# Patient Record
Sex: Female | Born: 1995 | Race: White | Hispanic: No | Smoking: Never smoker
Health system: Southern US, Community
[De-identification: ages and names within clinical notes are randomized; demographics above are authoritative.]

## PROBLEM LIST (undated history)

## (undated) DIAGNOSIS — R011 Cardiac murmur, unspecified: Secondary | ICD-10-CM

## (undated) HISTORY — PX: NO PAST SURGERIES: SHX2092

## (undated) HISTORY — DX: Cardiac murmur, unspecified: R01.1

---
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
## 2016-01-20 ENCOUNTER — Emergency Department
Admission: EM | Admit: 2016-01-20 | Discharge: 2016-01-20 | Disposition: A | Discharge status: Home / Self Care | Payer: Medicaid Other | Attending: Emergency Medicine | Admitting: Emergency Medicine

## 2016-01-20 DIAGNOSIS — T171XXA Foreign body in nostril, initial encounter: Secondary | ICD-10-CM | POA: Diagnosis present

## 2016-01-20 DIAGNOSIS — Z880 Allergy status to penicillin: Secondary | ICD-10-CM | POA: Insufficient documentation

## 2016-01-20 DIAGNOSIS — W268XXA Contact with other sharp object(s), not elsewhere classified, initial encounter: Secondary | ICD-10-CM | POA: Diagnosis not present

## 2016-01-20 DIAGNOSIS — Y9389 Activity, other specified: Secondary | ICD-10-CM | POA: Insufficient documentation

## 2016-01-20 DIAGNOSIS — Y998 Other external cause status: Secondary | ICD-10-CM | POA: Diagnosis not present

## 2016-01-20 DIAGNOSIS — Z189 Retained foreign body fragments, unspecified material: Secondary | ICD-10-CM

## 2016-01-20 DIAGNOSIS — Y929 Unspecified place or not applicable: Secondary | ICD-10-CM | POA: Diagnosis not present

## 2016-01-20 DIAGNOSIS — W278XXA Contact with other nonpowered hand tool, initial encounter: Secondary | ICD-10-CM

## 2016-01-20 NOTE — ED Notes (Signed)
Patient reports had nose pierced on Thursday.  Today unable to get stud out of her nose.

## 2016-01-20 NOTE — Discharge Instructions (Signed)
Keep the nose piercing site clean and covered with antibiotic ointment.

## 2016-01-21 NOTE — ED Provider Notes (Signed)
CSN: [id number]     Arrival date & time 01/20/16  1943 History   First MD Initiated Contact with Patient 01/20/16 2202     Chief Complaint  Patient presents with  . Foreign Body in Nose   HPI  20 year old female patient presents to the ED for removal of a small piercing to the left nostril. Patient describes having the small piercing done on Thursday, about 3 days prior to arrival. Since that time she's had some increased swelling at the piercing site. Today she was unable to remove the stud as the swelling caused the head of the stud to get embedded in the nose. The patient denies any other injury at this time. Local pain and swelling to the left nostril. It is a discomfort is 7/10 in triage.  Past Medical History  Diagnosis Date  . Murmur    No past surgical history on file. Family History  Problem Relation Age of Onset  . Hypertension Father   . Heart disease Maternal Grandfather    Social History  Substance Use Topics  . Smoking status: Never Smoker   . Smokeless tobacco: Not on file  . Alcohol Use: No   OB History    Gravida Para Term Preterm AB TAB SAB Ectopic Multiple Living   0 0 0 0 0 0 0 0 0 0      Review of Systems  Constitutional: Negative.   HENT: Negative.        Left naris foreign body as above  Respiratory: Negative.   Skin: Negative.       Allergies  Amoxicillin; Penicillin g; and Penicillins  Home Medications   Prior to Admission medications   Medication Sig Start Date End Date Taking? Authorizing Provider  butalbital-acetaminophen-caffeine (FIORICET, ESGIC) 50-325-40 MG tablet Take by mouth 2 (two) times daily as needed for headache.    Historical Provider, MD  diclofenac sodium (VOLTAREN) 1 % GEL Apply 2 g topically 4 (four) times daily. Rub into affected area of foot 2 to 4 times daily 12/21/14   Vivi BarrackMatthew R Wagoner, DPM  Loratadine (CLARITIN PO) Take by mouth as needed.    Historical Provider, MD  norethindrone-ethinyl estradiol (JUNEL FE 1/20) 1-20  MG-MCG tablet Take 1 tablet by mouth daily. 12/25/15   Hildred LaserAnika Cherry, MD   BP 128/65 mmHg  Pulse 82  Temp(Src) 99.1 F (37.3 C) (Oral)  Resp 16  Ht 5\' 8"  (1.727 m)  Wt 104.327 kg  BMI 34.98 kg/m2  SpO2 98%  LMP 01/15/2016 (Exact Date) Physical Exam  Constitutional: She appears well-developed and well-nourished.  HENT:  Head: Normocephalic and atraumatic.  Left naris with local erythema to the small piercing site. Earring post is visible in the left vestibule.  Eyes: EOM are normal. Pupils are equal, round, and reactive to light.  Pulmonary/Chest: Effort normal and breath sounds normal.  Skin: Skin is warm and dry.    ED Course  .Foreign Body Removal Date/Time: 01/21/2016 12:51 AM Performed by: Lissa HoardMENSHEW, Remijio Holleran V BACON Authorized by: Lissa HoardMENSHEW, Yasaman Kolek V BACON Consent: Verbal consent obtained. Written consent not obtained. Risks and benefits: risks, benefits and alternatives were discussed Consent given by: patient Patient understanding: patient states understanding of the procedure being performed Patient consent: the patient's understanding of the procedure matches consent given Procedure consent: procedure consent matches procedure scheduled Site marked: the operative site was marked Patient identity confirmed: verbally with patient Body area: nose Location details: left nostril Anesthesia: local infiltration Local anesthetic: topical anesthetic Patient sedated: no Patient  restrained: no Patient cooperative: yes Localization method: visualized Removal mechanism: forceps Complexity: simple 1 objects recovered. Objects recovered: embedded nose pierce Post-procedure assessment: foreign body removed Patient tolerance: Patient tolerated the procedure well with no immediate complications Comments: Patient s/p successful removal of a partially-embedded nasal stud in the left naris   (including critical care time)  Labs Review Labs Reviewed - No data to display  Imaging  Review No results found.    EKG Interpretation None      MDM   Final diagnoses:  Body piercing as cause of accidental injury  Embedded foreign body    Patient with an acute embedded foreign body to the left nostril also by a recent nasal piercing, s/p removal. Patient is discharged with instructions to keep the local wound clean and cover with antibiotic ointment. She will follow with primary care provider or return to the ED as needed.    Charlesetta Ivory [place], PA-C 01/21/16 0059  Jennye Moccasin, MD 01/21/16 [phone]

## 2017-02-12 ENCOUNTER — Other Ambulatory Visit (INDEPENDENT_AMBULATORY_CARE_PROVIDER_SITE_OTHER): Payer: Self-pay | Admitting: Otolaryngology

## 2017-02-12 DIAGNOSIS — J329 Chronic sinusitis, unspecified: Secondary | ICD-10-CM

## 2017-02-17 ENCOUNTER — Ambulatory Visit
Admission: RE | Admit: 2017-02-17 | Discharge: 2017-02-17 | Disposition: A | Discharge status: Home / Self Care | Payer: BLUE CROSS/BLUE SHIELD | Source: Ambulatory Visit | Attending: Otolaryngology | Admitting: Otolaryngology

## 2017-02-17 DIAGNOSIS — J329 Chronic sinusitis, unspecified: Secondary | ICD-10-CM

## 2018-02-03 ENCOUNTER — Other Ambulatory Visit: Payer: Self-pay | Admitting: Otolaryngology

## 2018-02-04 ENCOUNTER — Other Ambulatory Visit: Payer: Self-pay

## 2018-02-04 ENCOUNTER — Encounter (HOSPITAL_BASED_OUTPATIENT_CLINIC_OR_DEPARTMENT_OTHER): Payer: Self-pay | Admitting: *Deleted

## 2018-02-23 ENCOUNTER — Encounter (HOSPITAL_BASED_OUTPATIENT_CLINIC_OR_DEPARTMENT_OTHER): Payer: Self-pay | Admitting: Anesthesiology

## 2018-02-23 ENCOUNTER — Ambulatory Visit (HOSPITAL_BASED_OUTPATIENT_CLINIC_OR_DEPARTMENT_OTHER): Payer: BLUE CROSS/BLUE SHIELD | Admitting: Anesthesiology

## 2018-02-23 ENCOUNTER — Other Ambulatory Visit: Payer: Self-pay

## 2018-02-23 ENCOUNTER — Encounter (HOSPITAL_BASED_OUTPATIENT_CLINIC_OR_DEPARTMENT_OTHER)
Admission: RE | Disposition: A | Discharge status: Home / Self Care | Payer: Self-pay | Source: Ambulatory Visit | Attending: Otolaryngology

## 2018-02-23 ENCOUNTER — Ambulatory Visit (HOSPITAL_BASED_OUTPATIENT_CLINIC_OR_DEPARTMENT_OTHER)
Admission: RE | Admit: 2018-02-23 | Discharge: 2018-02-23 | Disposition: A | Discharge status: Home / Self Care | Payer: BLUE CROSS/BLUE SHIELD | Source: Ambulatory Visit | Attending: Otolaryngology | Admitting: Otolaryngology

## 2018-02-23 DIAGNOSIS — J310 Chronic rhinitis: Secondary | ICD-10-CM | POA: Insufficient documentation

## 2018-02-23 DIAGNOSIS — J343 Hypertrophy of nasal turbinates: Secondary | ICD-10-CM | POA: Diagnosis not present

## 2018-02-23 DIAGNOSIS — Z6841 Body Mass Index (BMI) 40.0 and over, adult: Secondary | ICD-10-CM | POA: Diagnosis not present

## 2018-02-23 DIAGNOSIS — J3489 Other specified disorders of nose and nasal sinuses: Secondary | ICD-10-CM | POA: Insufficient documentation

## 2018-02-23 DIAGNOSIS — E669 Obesity, unspecified: Secondary | ICD-10-CM | POA: Diagnosis not present

## 2018-02-23 HISTORY — PX: TURBINATE REDUCTION: SHX6157

## 2018-02-23 LAB — POCT PREGNANCY, URINE: Preg Test, Ur: NEGATIVE

## 2018-02-23 SURGERY — REDUCTION, NASAL TURBINATE
Anesthesia: General | Laterality: Bilateral

## 2018-02-23 MED ORDER — PROPOFOL 10 MG/ML IV BOLUS
INTRAVENOUS | Status: DC | PRN
  Administered 2018-02-23: 20 mg via INTRAVENOUS
  Administered 2018-02-23: 180 mg via INTRAVENOUS

## 2018-02-23 MED ORDER — CLINDAMYCIN PHOSPHATE 900 MG/50ML IV SOLN
INTRAVENOUS | Status: AC
  Filled 2018-02-23: qty 50

## 2018-02-23 MED ORDER — FENTANYL CITRATE (PF) 100 MCG/2ML IJ SOLN
INTRAMUSCULAR | Status: AC
  Filled 2018-02-23: qty 2

## 2018-02-23 MED ORDER — ROCURONIUM BROMIDE 50 MG/5ML IV SOLN
INTRAVENOUS | Status: AC
  Filled 2018-02-23: qty 1

## 2018-02-23 MED ORDER — LIDOCAINE HCL (CARDIAC) PF 100 MG/5ML IV SOSY
PREFILLED_SYRINGE | INTRAVENOUS | Status: AC
  Filled 2018-02-23: qty 5

## 2018-02-23 MED ORDER — PROMETHAZINE HCL 25 MG/ML IJ SOLN
INTRAMUSCULAR | Status: AC
  Filled 2018-02-23: qty 1

## 2018-02-23 MED ORDER — SCOPOLAMINE 1 MG/3DAYS TD PT72
1.0000 | MEDICATED_PATCH | TRANSDERMAL | Status: DC
  Administered 2018-02-23: 1.5 mg via TRANSDERMAL

## 2018-02-23 MED ORDER — ONDANSETRON HCL 4 MG/2ML IJ SOLN
INTRAMUSCULAR | Status: DC | PRN
  Administered 2018-02-23: 4 mg via INTRAVENOUS

## 2018-02-23 MED ORDER — ONDANSETRON HCL 4 MG/2ML IJ SOLN
INTRAMUSCULAR | Status: AC
  Filled 2018-02-23: qty 2

## 2018-02-23 MED ORDER — PROMETHAZINE HCL 25 MG/ML IJ SOLN
6.2500 mg | Freq: Once | INTRAMUSCULAR | Status: DC | PRN
  Administered 2018-02-23: 6.25 mg via INTRAVENOUS

## 2018-02-23 MED ORDER — MIDAZOLAM HCL 2 MG/2ML IJ SOLN
1.0000 mg | INTRAMUSCULAR | Status: DC | PRN
  Administered 2018-02-23: 2 mg via INTRAVENOUS

## 2018-02-23 MED ORDER — OXYCODONE-ACETAMINOPHEN 5-325 MG PO TABS: 1.0000 | ORAL_TABLET | ORAL | 0 refills | Status: DC | PRN

## 2018-02-23 MED ORDER — AZITHROMYCIN 500 MG PO TABS: 500.0000 mg | ORAL_TABLET | Freq: Every day | ORAL | 0 refills | Status: AC

## 2018-02-23 MED ORDER — SCOPOLAMINE 1 MG/3DAYS TD PT72
MEDICATED_PATCH | TRANSDERMAL | Status: AC
  Filled 2018-02-23: qty 1

## 2018-02-23 MED ORDER — SCOPOLAMINE 1 MG/3DAYS TD PT72: 1.0000 | MEDICATED_PATCH | Freq: Once | TRANSDERMAL | Status: DC | PRN

## 2018-02-23 MED ORDER — CLINDAMYCIN PHOSPHATE 900 MG/50ML IV SOLN
INTRAVENOUS | Status: DC | PRN
  Administered 2018-02-23: 900 mg via INTRAVENOUS

## 2018-02-23 MED ORDER — LIDOCAINE HCL (CARDIAC) PF 100 MG/5ML IV SOSY
PREFILLED_SYRINGE | INTRAVENOUS | Status: DC | PRN
  Administered 2018-02-23: 50 mg via INTRAVENOUS

## 2018-02-23 MED ORDER — FENTANYL CITRATE (PF) 100 MCG/2ML IJ SOLN: 25.0000 ug | INTRAMUSCULAR | Status: DC | PRN

## 2018-02-23 MED ORDER — METOCLOPRAMIDE HCL 5 MG/ML IJ SOLN
10.0000 mg | Freq: Once | INTRAMUSCULAR | Status: AC | PRN
  Administered 2018-02-23: 10 mg via INTRAVENOUS
  Filled 2018-02-23: qty 2

## 2018-02-23 MED ORDER — FENTANYL CITRATE (PF) 100 MCG/2ML IJ SOLN
50.0000 ug | INTRAMUSCULAR | Status: DC | PRN
  Administered 2018-02-23 (×2): 50 ug via INTRAVENOUS

## 2018-02-23 MED ORDER — MIDAZOLAM HCL 2 MG/2ML IJ SOLN
INTRAMUSCULAR | Status: AC
  Filled 2018-02-23: qty 2

## 2018-02-23 MED ORDER — HYDROCODONE-ACETAMINOPHEN 7.5-325 MG PO TABS: 1.0000 | ORAL_TABLET | Freq: Once | ORAL | Status: DC | PRN

## 2018-02-23 MED ORDER — OXYMETAZOLINE HCL 0.05 % NA SOLN
NASAL | Status: DC | PRN
  Administered 2018-02-23: 1

## 2018-02-23 MED ORDER — MEPERIDINE HCL 25 MG/ML IJ SOLN
6.2500 mg | INTRAMUSCULAR | Status: DC | PRN
Start: 2018-02-23 — End: 2018-02-23

## 2018-02-23 MED ORDER — PROPOFOL 10 MG/ML IV BOLUS
INTRAVENOUS | Status: AC
  Filled 2018-02-23: qty 20

## 2018-02-23 MED ORDER — SUCCINYLCHOLINE CHLORIDE 20 MG/ML IJ SOLN
INTRAMUSCULAR | Status: DC | PRN
  Administered 2018-02-23: 100 mg via INTRAVENOUS

## 2018-02-23 MED ORDER — LACTATED RINGERS IV SOLN
INTRAVENOUS | Status: DC
  Administered 2018-02-23: 10:00:00 via INTRAVENOUS

## 2018-02-23 MED ORDER — SODIUM CHLORIDE 0.9 % IJ SOLN
INTRAMUSCULAR | Status: AC
  Filled 2018-02-23: qty 10

## 2018-02-23 MED ORDER — DEXAMETHASONE SODIUM PHOSPHATE 10 MG/ML IJ SOLN
INTRAMUSCULAR | Status: AC
  Filled 2018-02-23: qty 1

## 2018-02-23 MED ORDER — METOCLOPRAMIDE HCL 5 MG/ML IJ SOLN: 10.0000 mg | Freq: Once | INTRAMUSCULAR | Status: DC

## 2018-02-23 MED ORDER — DEXAMETHASONE SODIUM PHOSPHATE 4 MG/ML IJ SOLN
INTRAMUSCULAR | Status: DC | PRN
  Administered 2018-02-23: 10 mg via INTRAVENOUS

## 2018-02-23 SURGICAL SUPPLY — 25 items
ATTRACTOMAT 16X20 MAGNETIC DRP (DRAPES) IMPLANT
CANISTER SUCT 1200ML W/VALVE (MISCELLANEOUS) ×3 IMPLANT
COAGULATOR SUCT 8FR VV (MISCELLANEOUS) ×3 IMPLANT
DECANTER SPIKE VIAL GLASS SM (MISCELLANEOUS) IMPLANT
ELECT REM PT RETURN 9FT ADLT (ELECTROSURGICAL) ×3
ELECTRODE REM PT RTRN 9FT ADLT (ELECTROSURGICAL) ×1 IMPLANT
GLOVE BIO SURGEON STRL SZ7 (GLOVE) ×3 IMPLANT
GLOVE BIO SURGEON STRL SZ7.5 (GLOVE) ×3 IMPLANT
GOWN STRL REUS W/ TWL LRG LVL3 (GOWN DISPOSABLE) ×1 IMPLANT
GOWN STRL REUS W/ TWL XL LVL3 (GOWN DISPOSABLE) ×1 IMPLANT
GOWN STRL REUS W/TWL LRG LVL3 (GOWN DISPOSABLE) ×2
GOWN STRL REUS W/TWL XL LVL3 (GOWN DISPOSABLE) ×2
NEEDLE HYPO 25X1 1.5 SAFETY (NEEDLE) IMPLANT
NS IRRIG 1000ML POUR BTL (IV SOLUTION) IMPLANT
PACK BASIN DAY SURGERY FS (CUSTOM PROCEDURE TRAY) ×3 IMPLANT
PACK ENT DAY SURGERY (CUSTOM PROCEDURE TRAY) ×3 IMPLANT
PACKING NASAL EPIS 4X2.4 XEROG (MISCELLANEOUS) IMPLANT
PATTIES SURGICAL .5 X3 (DISPOSABLE) IMPLANT
SLEEVE SCD COMPRESS KNEE MED (MISCELLANEOUS) IMPLANT
SOLUTION BUTLER CLEAR DIP (MISCELLANEOUS) ×3 IMPLANT
SPONGE GAUZE 2X2 8PLY STER LF (GAUZE/BANDAGES/DRESSINGS) ×1
SPONGE GAUZE 2X2 8PLY STRL LF (GAUZE/BANDAGES/DRESSINGS) ×2 IMPLANT
SPONGE NEURO XRAY DETECT 1X3 (DISPOSABLE) ×3 IMPLANT
TOWEL OR 17X24 6PK STRL BLUE (TOWEL DISPOSABLE) ×3 IMPLANT
YANKAUER SUCT BULB TIP NO VENT (SUCTIONS) ×3 IMPLANT

## 2018-02-23 NOTE — Anesthesia Preprocedure Evaluation (Signed)
Anesthesia Evaluation  Patient identified by MRN, date of birth, ID band Patient awake    Reviewed: Allergy & Precautions, NPO status , Patient's Chart, lab work & pertinent test results  Airway Mallampati: II  TM Distance: >3 FB Neck ROM: Full    Dental no notable dental hx. (+) Teeth Intact   Pulmonary  Bilateral turbinate hypertrophy   Pulmonary exam normal breath sounds clear to auscultation       Cardiovascular Normal cardiovascular exam+ Valvular Problems/Murmurs  Rhythm:Regular Rate:Normal     Neuro/Psych negative neurological ROS  negative psych ROS   GI/Hepatic negative GI ROS, Neg liver ROS,   Endo/Other  Obesity  Renal/GU negative Renal ROS  negative genitourinary   Musculoskeletal negative musculoskeletal ROS (+)   Abdominal (+) + obese,   Peds  Hematology negative hematology ROS (+)   Anesthesia Other Findings   Reproductive/Obstetrics                             Anesthesia Physical Anesthesia Plan  ASA: II  Anesthesia Plan: General   Post-op Pain Management:    Induction: Intravenous  PONV Risk Score and Plan: 4 or greater and Scopolamine patch - Pre-op, Midazolam, Dexamethasone, Ondansetron and Treatment may vary due to age or medical condition  Airway Management Planned: Oral ETT  Additional Equipment:   Intra-op Plan:   Post-operative Plan: Extubation in OR  Informed Consent: I have reviewed the patients History and Physical, chart, labs and discussed the procedure including the risks, benefits and alternatives for the proposed anesthesia with the patient or authorized representative who has indicated his/her understanding and acceptance.   Dental advisory given  Plan Discussed with: CRNA, Anesthesiologist and Surgeon  Anesthesia Plan Comments:         Anesthesia Quick Evaluation

## 2018-02-23 NOTE — Discharge Instructions (Addendum)
POSTOPERATIVE INSTRUCTIONS FOR PATIENTS HAVING NASAL OR SINUS OPERATIONS ACTIVITY: Restrict activity at home for the first two days, resting as much as possible. Light activity is best. You may usually return to work within a week. You should refrain from nose blowing, strenuous activity, or heavy lifting greater than 20lbs for a total of three weeks after your operation.  If sneezing cannot be avoided, sneeze with your mouth open. DISCOMFORT: You may experience a dull headache and pressure along with nasal congestion and discharge. These symptoms may be worse during the first week after the operation but may last as long as two to four weeks.  Please take Tylenol or the pain medication that has been prescribed for you. Do not take aspirin or aspirin containing medications since they may cause bleeding.  You may experience symptoms of post nasal drainage, nasal congestion, headaches and fatigue for two or three months after your operation.  BLEEDING: You may have some blood tinged nasal drainage for approximately two weeks after the operation.  The discharge will be worse for the first week.  Please call our office at [phone] or go to the nearest hospital emergency room if you experience any of the following: heavy, bright red blood from your nose or mouth that lasts longer than ten minutes or coughing up or vomiting bright red blood or blood clots. GENERAL CONSIDERATIONS: 1. A gauze dressing will be placed on your upper lip to absorb any drainage after the operation. You may need to change this several times a day.  If you do not have very much drainage, you may remove the dressing.  Remember that you may gently wipe your nose with a tissue and sniff in, but DO NOT blow your nose. 2. Please keep all of your postoperative appointments.  Your final results after the operation will depend on proper follow-up.  The initial visit is usually four to seven days after the operation.  During this visit, the  remaining nasal packing and internal septal splints will be removed.  Your nasal and sinus cavities will be cleaned.  During the second visit, your nasal and sinus cavities will be cleaned again. Have someone drive you to your first two postoperative appointments. We suggest that you take your prescribed pain medication about  hour prior to each of these two appointments.  3. How you care for your nose after the operation will influence the results that you obtain.  You should follow all directions, take your medication as prescribed, and call our office [phone] with any problems or questions. 4. You may be more comfortable sleeping with your head elevated on two pillows. 5. Do not take any medications that we have not prescribed or recommended. WARNING SIGNS: if any of the following should occur, please call our office: 1. Bright red bleeding which lasts more than 10 minutes. 2. Persistent fever greater than 102F. 3. Persistent vomiting. 4. Severe and constant pain that is not relieved by prescribed pain medication. 5. Trauma to the nose. 6. Rash or unusual side effects from any medicines.  ---------------------  Excuse from Work, [employer], or Physical Activity Stuckert Stutts_ needs to be excused from: _x__ Work ____ [employer] ____ Physical activity beginning now and through the following date: _5/12/2019_. He or she may return to work on  _5/13/2019_______________.  Health Care Provider : __Su Philomena Doheny, MD____ Date: _5/7/2019__ This information is not intended to replace advice given to you by your health care provider. Make sure you discuss any questions you have  with your health care provider. Document Released: 04/01/2001 Document Revised: 09/19/2016 Document Reviewed: 05/08/2014 Elsevier Interactive Patient Education  2018 [employer].   Post Anesthesia Home Care Instructions  Activity: Get plenty of rest for the remainder of the day. A responsible individual must stay with  you for 24 hours following the procedure.  For the next 24 hours, DO NOT: -Drive a car -Advertising copywriter -Drink alcoholic beverages -Take any medication unless instructed by your physician -Make any legal decisions or sign important papers.  Meals: Start with liquid foods such as gelatin or soup. Progress to regular foods as tolerated. Avoid greasy, spicy, heavy foods. If nausea and/or vomiting occur, drink only clear liquids until the nausea and/or vomiting subsides. Call your physician if vomiting continues.  Special Instructions/Symptoms: Your throat may feel dry or sore from the anesthesia or the breathing tube placed in your throat during surgery. If this causes discomfort, gargle with warm salt water. The discomfort should disappear within 24 hours.  If you had a scopolamine patch placed behind your ear for the management of post- operative nausea and/or vomiting:  1. The medication in the patch is effective for 72 hours, after which it should be removed.  Wrap patch in a tissue and discard in the trash. Wash hands thoroughly with soap and water. 2. You may remove the patch earlier than 72 hours if you experience unpleasant side effects which may include dry mouth, dizziness or visual disturbances. 3. Avoid touching the patch. Wash your hands with soap and water after contact with the patch.

## 2018-02-23 NOTE — Anesthesia Postprocedure Evaluation (Signed)
Anesthesia Post Note  Patient: Rose Bradley  Procedure(s) Performed: BILATERAL TURBINATE REDUCTION (Bilateral )     Patient location during evaluation: PACU Anesthesia Type: General Level of consciousness: awake and alert and oriented Pain management: pain level controlled Vital Signs Assessment: post-procedure vital signs reviewed and stable Respiratory status: spontaneous breathing, nonlabored ventilation and respiratory function stable Cardiovascular status: blood pressure returned to baseline and stable Postop Assessment: no apparent nausea or vomiting Anesthetic complications: no    Last Vitals:  Vitals:   02/23/18 1230 02/23/18 1245  BP: 114/60 (!) 112/51  Pulse: 79 77  Resp: 16 18  Temp:    SpO2: 99% 99%    Last Pain:  Vitals:   02/23/18 1245  TempSrc:   PainSc: 2                  Farzad Tibbetts A.

## 2018-02-23 NOTE — H&P (Signed)
Cc: Chronic nasal obstruction  HPI: The patient is a 22 year old female who returns today complaining of chronic nasal obstruction. The patient was last seen in 01/2017.  At that time, she was noted to have chronic rhinitis, severe nasal mucosal congestion and bilateral inferior turbinate hypertrophy.  The patient was placed on Flonase nasal spray and Prednisone Dosepak.  The patient could not tolerate the use of prednisone due to its side effects.  She reports no improvement with the Flonase nasal spray.  She also underwent a paranasal sinus CT scan. The CT showed no significant acute or chronic sinusitis.  Mild mucosal edema was noted within her ethmoid sinuses.  The patient returns today complaining of worsening of her nasal obstruction.  She also reports frequent postnasal drainage.  Currently she denies any fever, purulent drainage, or visual change. No other ENT, GI, or respiratory issue noted since the last visit.   Exam: General: Communicates without difficulty, well nourished, no acute distress. Head: Normocephalic, no evidence injury, no tenderness, facial buttresses intact without stepoff. Eyes: PERRL, EOMI. No scleral icterus, conjunctivae clear. Neuro: CN II exam reveals vision grossly intact.  No nystagmus at any point of gaze. Ears: Auricles well formed without lesions.  Ear canals are intact without mass or lesion.  No erythema or edema is appreciated.  The TMs are intact without fluid. Nose: External evaluation reveals normal support and skin without lesions.  Dorsum is intact.  Anterior rhinoscopy reveals severe congested and edematous mucosa over anterior aspect of the inferior turbinates and nasal septum.  No purulence is noted. Middle meatus is not well visualized. Oral:  Oral cavity and oropharynx are intact, symmetric, without erythema or edema.  Mucosa is moist without lesions. Neck: Full range of motion without pain.  There is no significant lymphadenopathy.  No masses palpable.   Thyroid bed within normal limits to palpation.  Parotid glands and submandibular glands equal bilaterally without mass.  Trachea is midline. Neuro:  CN 2-12 grossly intact. Gait normal. Vestibular: No nystagmus at any point of gaze.   Assessment 1.  Severe chronic rhinitis with nasal mucosal congestion and bilateral severe inferior turbinate hypertrophy.  More than 95% of her nasal passageways are obstructed bilaterally.  2.  There is no evidence of acute infection today.   Plan  1.  The physical exam findings are reviewed with the patient and her mother.  2.  Continue Flonase nasal spray to treat her chronic rhinitis.  3.  Atrovent nasal spray 2 sprays each nostril b.i.d. prn postnasal drainage. The patient may benefit from undergoing partial resection of her inferior turbinate.  This will improve the patency of her nasal passageways.  The risks, benefits, details of the procedure are reviewed.  4.  The patient would like to proceed with the procedure.

## 2018-02-23 NOTE — Anesthesia Procedure Notes (Signed)
Procedure Name: Intubation Date/Time: 02/23/2018 10:35 AM Performed by: Caren Macadam, CRNA Pre-anesthesia Checklist: Patient identified, Emergency Drugs available, Suction available and Patient being monitored Patient Re-evaluated:Patient Re-evaluated prior to induction Oxygen Delivery Method: Circle system utilized Preoxygenation: Pre-oxygenation with 100% oxygen Induction Type: IV induction Ventilation: Mask ventilation without difficulty Laryngoscope Size: Miller and 2 Grade View: Grade I Tube type: Oral Tube size: 7.0 mm Number of attempts: 1 Airway Equipment and Method: Stylet and Oral airway Placement Confirmation: ETT inserted through vocal cords under direct vision,  positive ETCO2 and breath sounds checked- equal and bilateral Secured at: 22 cm Tube secured with: Tape Dental Injury: Teeth and Oropharynx as per pre-operative assessment

## 2018-02-23 NOTE — Transfer of Care (Signed)
Immediate Anesthesia Transfer of Care Note  Patient: Rose Bradley  Procedure(s) Performed: BILATERAL TURBINATE REDUCTION (Bilateral )  Patient Location: PACU  Anesthesia Type:General  Level of Consciousness: awake  Airway & Oxygen Therapy: Patient Spontanous Breathing and Patient connected to face mask oxygen  Post-op Assessment: Report given to RN and Post -op Vital signs reviewed and stable  Post vital signs: Reviewed and stable  Last Vitals:  Vitals Value Taken Time  BP    Temp    Pulse 82 02/23/2018 11:38 AM  Resp 27 02/23/2018 11:38 AM  SpO2 98 % 02/23/2018 11:38 AM  Vitals shown include unvalidated device data.  Last Pain:  Vitals:   02/23/18 0938  TempSrc: Oral  PainSc: 0-No pain         Complications: No apparent anesthesia complications

## 2018-02-23 NOTE — Op Note (Signed)
DATE OF PROCEDURE: 02/23/2018  OPERATIVE REPORT   SURGEON: Newman Pies, MD   PREOPERATIVE DIAGNOSES:  1. Chronic nasal obstruction.  2. Bilateral inferior turbinate hypertrophy.   POSTOPERATIVE DIAGNOSES:  1. Chronic nasal obstruction.  2. Bilateral inferior turbinate hypertrophy.   PROCEDURE PERFORMED: Bilateral partial inferior turbinate resection.   ANESTHESIA: General endotracheal tube anesthesia.   COMPLICATIONS: None.   ESTIMATED BLOOD LOSS: Minimal.   INDICATION FOR PROCEDURE :Rose Bradley is a 23 y.o. female with a history of chronic nasal obstruction. The patient was treated with antihistamine, decongestant, and steroid nasal spray. However, the patient continues to be symptomatic. On examination, the patient was noted to have bilateral severe inferior turbinate hypertrophy, causing significant nasal obstruction. Based on the above findings, the decision was made for the patient to undergo the above-stated procedure. The risks, benefits, alternatives, and details of the procedure were discussed with the patient. Questions were invited and answered. Informed consent was obtained.   DESCRIPTION: The patient was taken to the operating room and placed supine on the operating table. General endotracheal tube anesthesia was administered by the anesthesiologist. The patient was positioned and prepped and draped in a standard fashion for nasal surgery. Pledgets soaked with Afrin were placed in both nasal cavities. The pledgets were subsequently removed. Examination of the nasal cavities revealed bilateral severe inferior turbinate hypertrophy. The inferior one-half of each inferior turbinate was then crossclamped with a straight Kelly clamp. The inferior one-half of each inferior turbinate was then resected with a pair of cross cutting scissors. Hemostasis was achieved with suction electrocautery, under direct visual guidance of the zero-degree endoscope. Good hemostasis was achieved. The care of  the patient was turned over to the anesthesiologist. The patient was awakened from anesthesia without difficulty. The patient was extubated and transferred to the recovery room in good condition.   OPERATIVE FINDINGS: Bilateral inferior turbinate hypertrophy.   SPECIMEN: None.   FOLLOWUP CARE: The patient will be discharged home once she is awake and alert. The patient will be placed on percocet. p.r.n. pain, and azithromycin for 3 days. The patient will follow up in my office in approximately 1 week.   Newman Pies, MD

## 2018-02-24 ENCOUNTER — Encounter (HOSPITAL_BASED_OUTPATIENT_CLINIC_OR_DEPARTMENT_OTHER): Payer: Self-pay | Admitting: Otolaryngology

## 2018-06-01 ENCOUNTER — Ambulatory Visit (HOSPITAL_COMMUNITY)
Admission: EM | Admit: 2018-06-01 | Discharge: 2018-06-01 | Disposition: A | Discharge status: Home / Self Care | Payer: BLUE CROSS/BLUE SHIELD | Attending: Family Medicine | Admitting: Family Medicine

## 2018-06-01 ENCOUNTER — Ambulatory Visit (INDEPENDENT_AMBULATORY_CARE_PROVIDER_SITE_OTHER): Payer: BLUE CROSS/BLUE SHIELD

## 2018-06-01 ENCOUNTER — Encounter (HOSPITAL_COMMUNITY): Payer: Self-pay | Admitting: Emergency Medicine

## 2018-06-01 DIAGNOSIS — W208XXA Other cause of strike by thrown, projected or falling object, initial encounter: Secondary | ICD-10-CM | POA: Diagnosis not present

## 2018-06-01 DIAGNOSIS — S9031XA Contusion of right foot, initial encounter: Secondary | ICD-10-CM | POA: Diagnosis not present

## 2018-06-01 DIAGNOSIS — M25571 Pain in right ankle and joints of right foot: Secondary | ICD-10-CM

## 2018-06-01 MED ORDER — NAPROXEN 500 MG PO TABS: 500.0000 mg | ORAL_TABLET | Freq: Two times a day (BID) | ORAL | 0 refills | Status: DC | PRN

## 2018-06-01 NOTE — Discharge Instructions (Addendum)
X-rays did not show a fracture or dislocation Crutches given Boot given Continue conservative management of rest, ice, and elevation Take naproxen as needed for pain relief (may cause abdominal discomfort, ulcers, and GI bleeds avoid taking with other NSAIDs) Follow up with PCP if symptoms persist Return or go to the ER if you have any new or worsening symptoms (fever, chills, chest pain, abdominal pain, changes in bowel or bladder habits, pain radiating into lower legs, etc...)

## 2018-06-01 NOTE — ED Triage Notes (Signed)
PT dropped a 6 lb frozen package of chicken on right foot this morning.

## 2018-06-01 NOTE — ED Provider Notes (Signed)
[facility]   [id number] 06/01/18 Arrival Time: 1034  SUBJECTIVE: History from: patient. Rose Bradley is a 22 y.o. female complains of right foot pain that began today.  Symptoms began after dropping a 6 lbs frozen chicken legs on her right foot.  Localizes the pain to the top of right foot.  Describes the pain as constant and throbbing in character.  Denies alleviating factors.  Symptoms are made worse with weight-bearing and walking.  Denies similar symptoms in the past.  Complains of ecchymosis.  Denies fever, chills, nausea, vomiting, erythema, ecchymosis, effusion, weakness, numbness and tingling.      ROS: As per HPI.  Past Medical History:  Diagnosis Date  . Murmur    Past Surgical History:  Procedure Laterality Date  . NO PAST SURGERIES    . TURBINATE REDUCTION Bilateral 02/23/2018   Procedure: BILATERAL TURBINATE REDUCTION;  Surgeon: Newman Pieseoh, Su, MD;  Location: [facility];  Service: ENT;  Laterality: Bilateral;   Allergies  Allergen Reactions  . Prednisone Nausea And Vomiting  . Amoxicillin Nausea And Vomiting  . Penicillin G     Other reaction(s): Unknown  . Penicillins Other (See Comments)    unknown   No current facility-administered medications on file prior to encounter.    Current Outpatient Medications on File Prior to Encounter  Medication Sig Dispense Refill  . butalbital-acetaminophen-caffeine (FIORICET, ESGIC) 50-325-40 MG tablet Take by mouth 2 (two) times daily as needed for headache.    . Loratadine (CLARITIN PO) Take by mouth as needed.    Marland Kitchen. oxyCODONE-acetaminophen (PERCOCET/ROXICET) 5-325 MG tablet Take 1 tablet by mouth every 4 (four) hours as needed for severe pain. 20 tablet 0   Social History   Socioeconomic History  . Marital status: Single    Spouse name: Not on file  . Number of children: Not on file  . Years of education: Not on file  . Highest education level: Not on file  Occupational History  . Not on file    Social Needs  . Financial resource strain: Not on file  . Food insecurity:    Worry: Not on file    Inability: Not on file  . Transportation needs:    Medical: Not on file    Non-medical: Not on file  Tobacco Use  . Smoking status: Never Smoker  . Smokeless tobacco: Never Used  Substance and Sexual Activity  . Alcohol use: No    Alcohol/week: 0.0 standard drinks  . Drug use: No  . Sexual activity: Never  Lifestyle  . Physical activity:    Days per week: Not on file    Minutes per session: Not on file  . Stress: Not on file  Relationships  . Social connections:    Talks on phone: Not on file    Gets together: Not on file    Attends religious service: Not on file    Active member of club or organization: Not on file    Attends meetings of clubs or organizations: Not on file    Relationship status: Not on file  . Intimate partner violence:    Fear of current or ex partner: Not on file    Emotionally abused: Not on file    Physically abused: Not on file    Forced sexual activity: Not on file  Other Topics Concern  . Not on file  Social History Narrative  . Not on file   Family History  Problem Relation Age of Onset  .  Hypertension Father   . Heart disease Maternal Grandfather     OBJECTIVE:  Vitals:   06/01/18 1049  BP: (!) 158/88  Pulse: 97  Resp: 16  Temp: 98.2 F (36.8 C)  TempSrc: Oral  SpO2: 100%    General appearance: AOx3; in no acute distress.  Head: NCAT Lungs: CTA bilaterally Heart: RRR.  Clear S1 and S2 without murmur, gallops, or rubs.  Radial pulses 2+ bilaterally. Musculoskeletal: Right foot Inspection: Skin warm, dry, clear and intact without obvious erythema, effusion, or ecchymosis.  Palpation: diffusely tender over the distal metatarsal and phlanges ROM: LROM about toes Strength: 5/5 knee flexion, 5/5 knee extension, 5/5 dorsiflexion, 5/5 plantar flexion CV: posterior tibial 2+, dorsalis pedis 2+, cap refill <2 secs Skin: warm and  dry Neurologic: Ambulates with antalgic gait; Sensation intact about the lower extremities Psychological: alert and cooperative; normal mood and affect   DIAGNOSTIC STUDIES:  Dg Foot Complete Right  Result Date: 06/01/2018 CLINICAL DATA:  Status post fall.  Pain. EXAM: RIGHT FOOT COMPLETE - 3+ VIEW COMPARISON:  None. FINDINGS: There is no evidence of fracture or dislocation. There is a bipartite medial hallux sesamoid. There is no evidence of arthropathy or other focal bone abnormality. Soft tissues are unremarkable. IMPRESSION: Negative. Electronically Signed   By: Elige KoHetal  Patel   On: 06/01/2018 11:42    ASSESSMENT & PLAN:  1. Contusion of right foot, initial encounter   2. Pain in joint involving right ankle and foot     Meds ordered this encounter  Medications  . naproxen (NAPROSYN) 500 MG tablet    Sig: Take 1 tablet (500 mg total) by mouth 2 (two) times daily as needed for moderate pain.    Dispense:  30 tablet    Refill:  0    Order Specific Question:   Supervising Provider    Answer:   Isa RankinMURRAY, LAURA WILSON [[id number]]   X-rays did not show a fracture or dislocation Crutches given Boot given Continue conservative management of rest, ice, and elevation Take naproxen as needed for pain relief (may cause abdominal discomfort, ulcers, and GI bleeds avoid taking with other NSAIDs) Follow up with PCP if symptoms persist Return or go to the ER if you have any new or worsening symptoms (fever, chills, chest pain, abdominal pain, changes in bowel or bladder habits, pain radiating into lower legs, etc...)   Reviewed expectations re: course of current medical issues. Questions answered. Outlined signs and symptoms indicating need for more acute intervention. Patient verbalized understanding. After Visit Summary given.    Rennis HardingWurst, Rose Cedrone, Rose Bradley 06/01/18 1205

## 2018-06-01 NOTE — ED Notes (Signed)
Patient requested change to work note to read a return to work date of Friday.  GrenadaBrittany, NP changed date on note and initialed.  Patient to return to work on Friday.    On discharge, patient left treatment room using proper technique for crutches.  Half way down the hall, patient placed both crutches in right hand and walked out of the building.

## 2020-08-24 ENCOUNTER — Other Ambulatory Visit: Payer: Self-pay | Admitting: Occupational Medicine

## 2020-08-24 ENCOUNTER — Other Ambulatory Visit: Payer: Self-pay

## 2020-08-24 ENCOUNTER — Ambulatory Visit: Payer: Self-pay

## 2020-08-24 DIAGNOSIS — M533 Sacrococcygeal disorders, not elsewhere classified: Secondary | ICD-10-CM

## 2020-12-29 IMAGING — DX DG SACRUM/COCCYX 2+V
3 series · 3 of 3 positions shown · non-contrast
Comparison: None.

CLINICAL DATA: 24-year-old female status post fall today. Coccygeal
pain.

EXAM:
SACRUM AND COCCYX - 2+ VIEW

[coccyx ap]
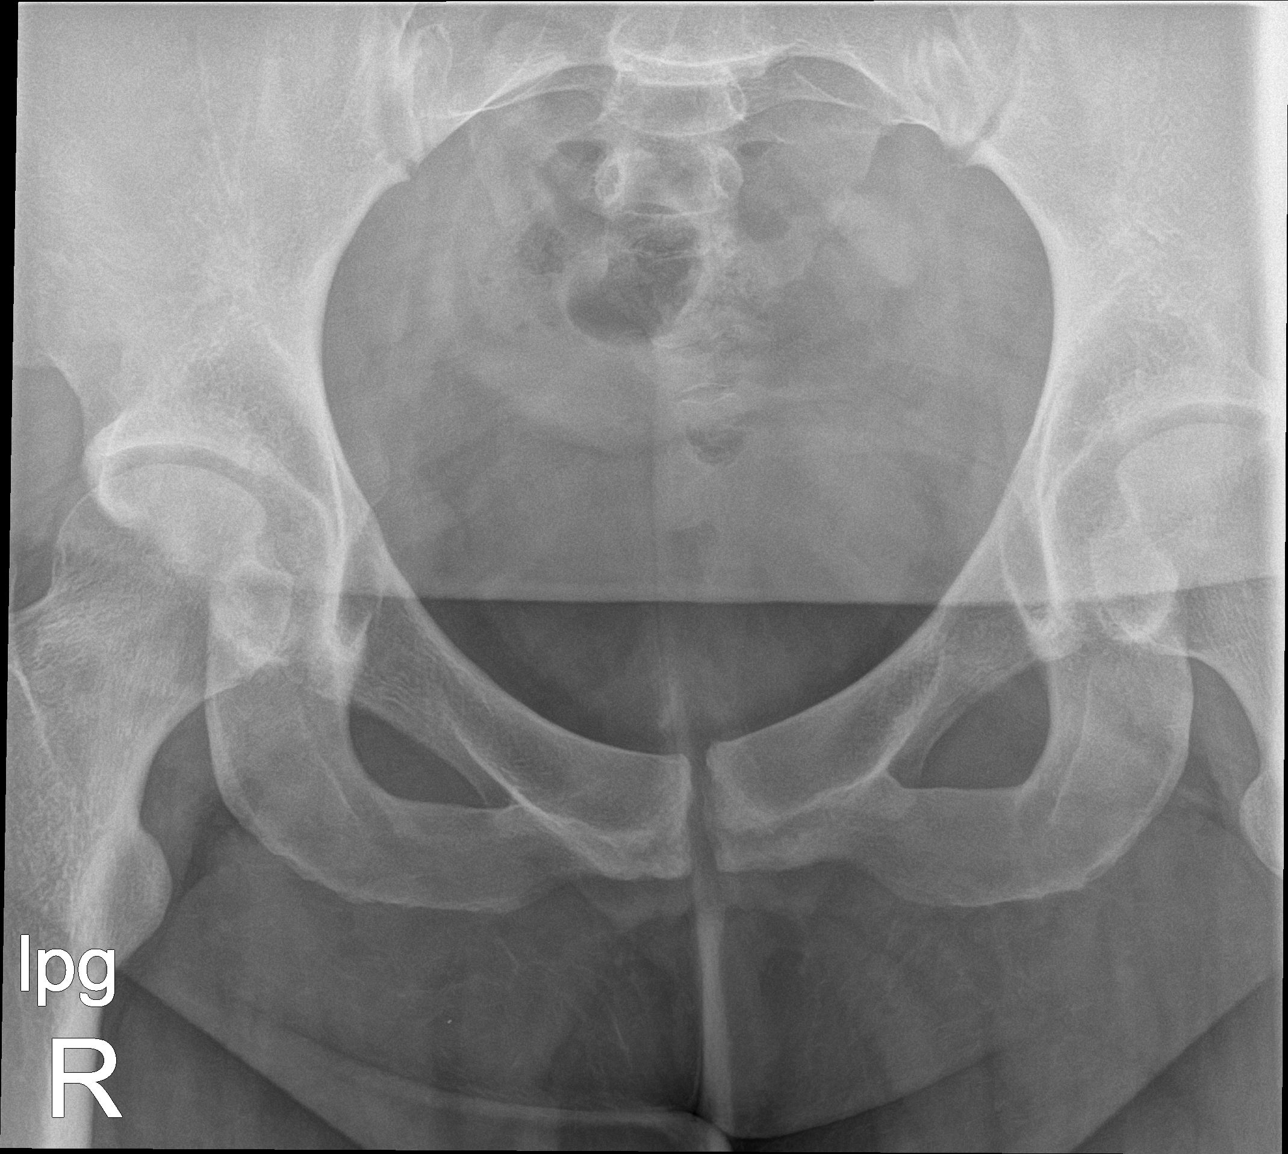

[sacrum ap]
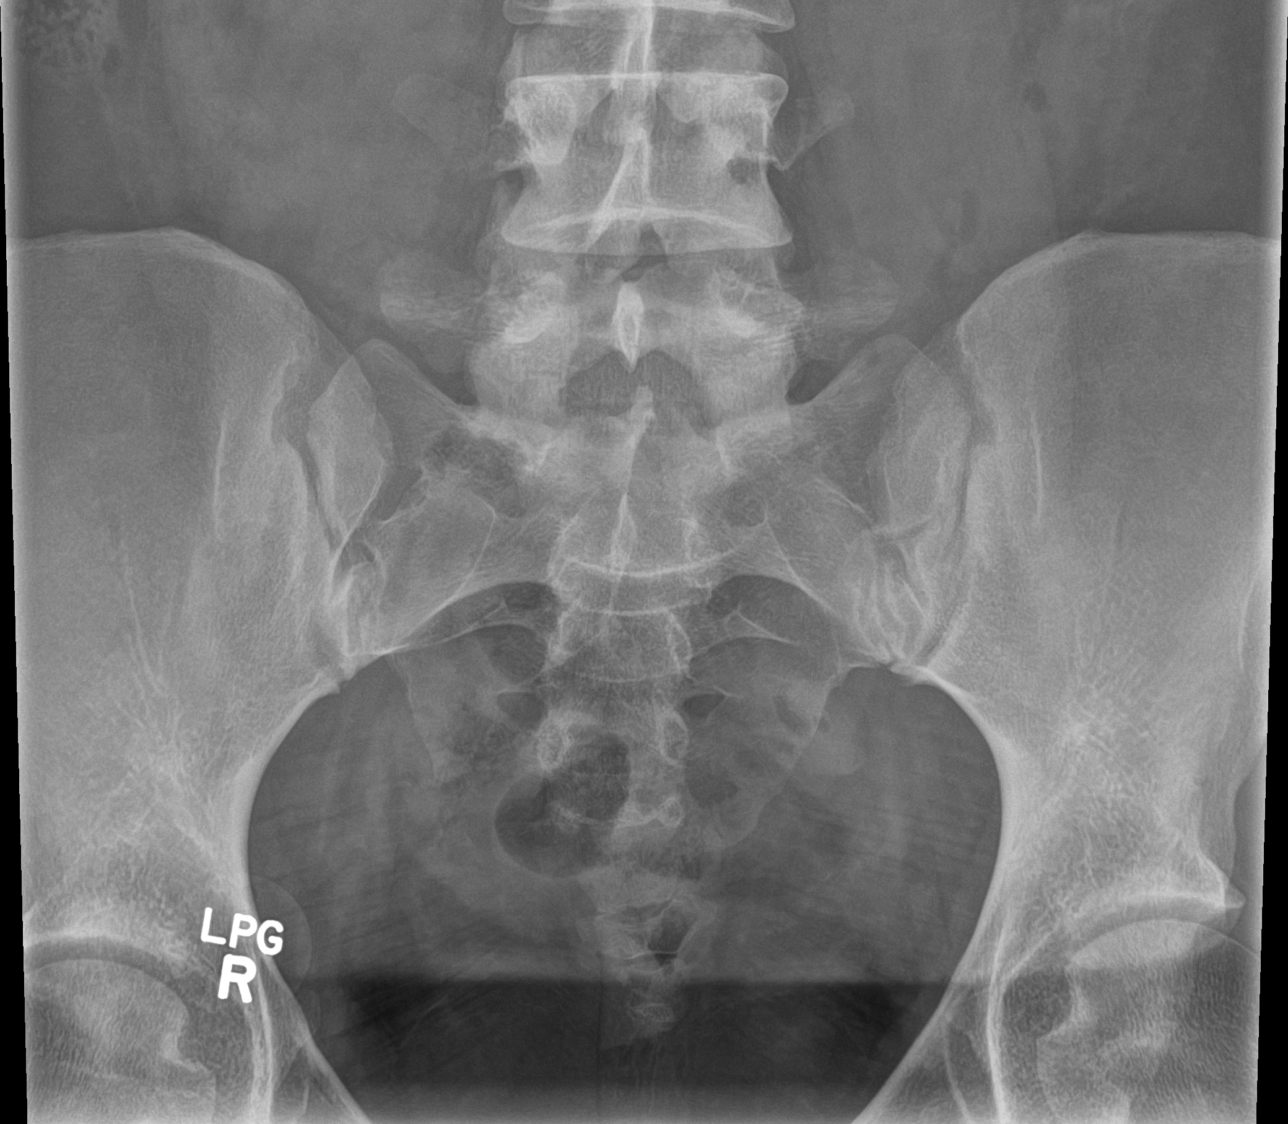

[sacrum lat]
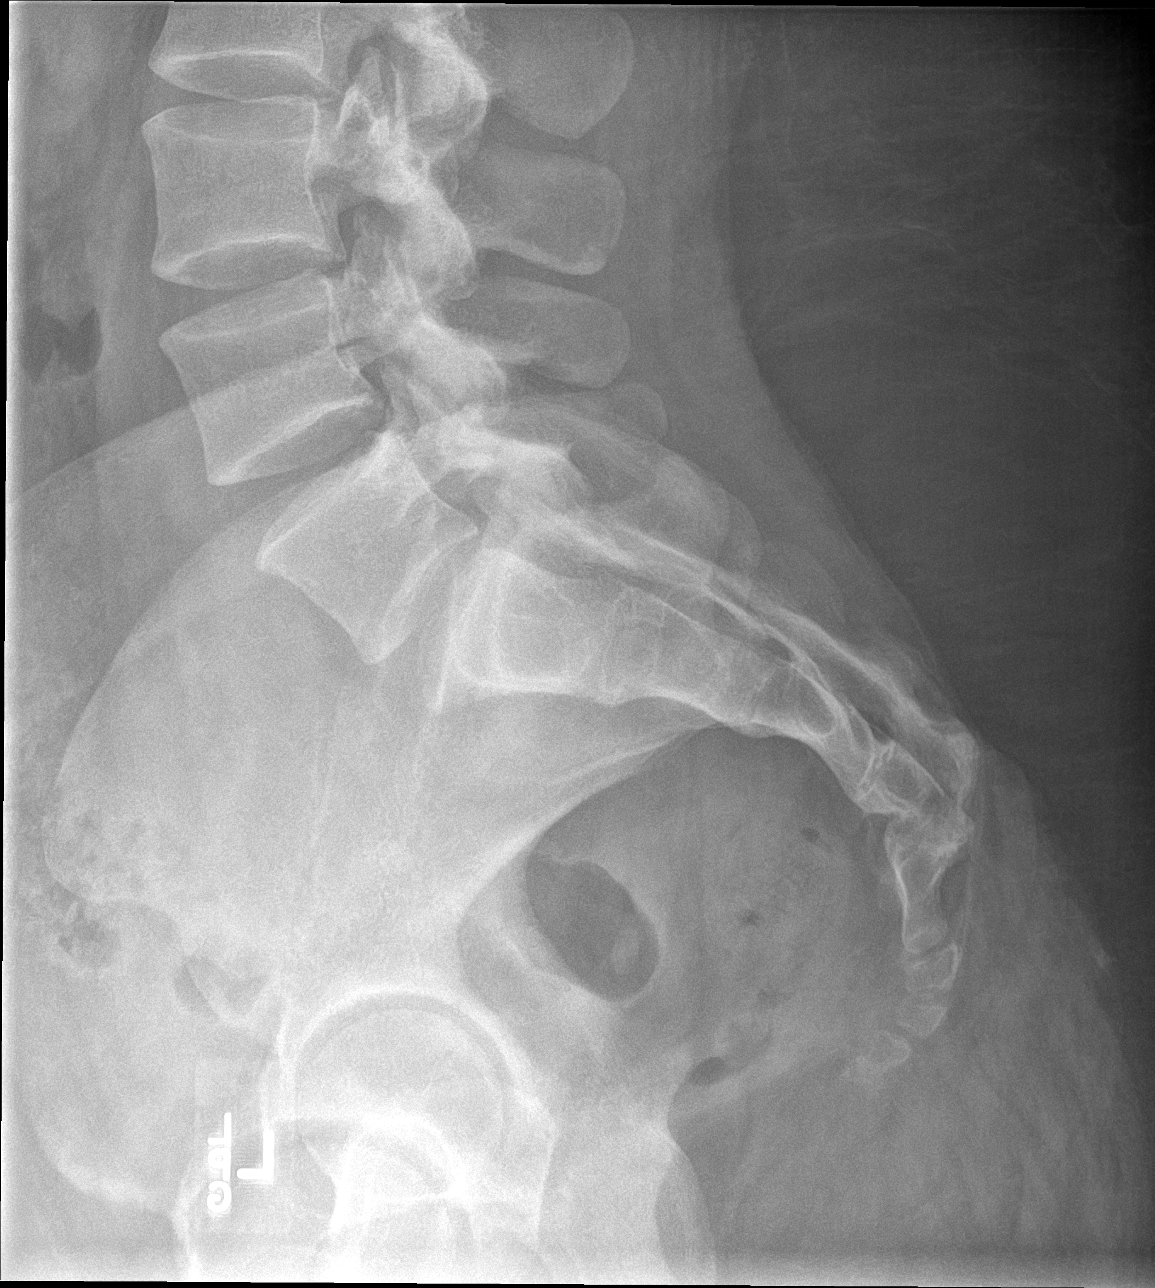

[3 of 3 positions shown; findings below may reference images not displayed]

FINDINGS: Bone mineralization is within normal limits. Visible pelvic ring
appears intact. SI joints appear normal. On the lateral view there
is an abrupt ventral angulation of the lower sacrum at S4 segment.
Although no discrete fracture lucency is identified. The S5 and
coccygeal segments appear within normal limits. Grossly normal
visible lower lumbar levels.
IMPRESSION: 1. Age indeterminate angulated fracture of the lower sacrum at the
S4 segment, likely acute in this setting.
2. Coccyx and other sacral segments appear intact.

## 2021-01-16 ENCOUNTER — Ambulatory Visit (INDEPENDENT_AMBULATORY_CARE_PROVIDER_SITE_OTHER): Payer: 59 | Admitting: Podiatry

## 2021-01-16 ENCOUNTER — Other Ambulatory Visit: Payer: Self-pay | Admitting: Podiatry

## 2021-01-16 ENCOUNTER — Encounter: Payer: Self-pay | Admitting: Podiatry

## 2021-01-16 ENCOUNTER — Ambulatory Visit (INDEPENDENT_AMBULATORY_CARE_PROVIDER_SITE_OTHER): Payer: 59

## 2021-01-16 ENCOUNTER — Other Ambulatory Visit: Payer: Self-pay

## 2021-01-16 DIAGNOSIS — S93602A Unspecified sprain of left foot, initial encounter: Secondary | ICD-10-CM

## 2021-01-16 DIAGNOSIS — M7672 Peroneal tendinitis, left leg: Secondary | ICD-10-CM | POA: Diagnosis not present

## 2021-01-16 MED ORDER — CELECOXIB 200 MG PO CAPS: 200.0000 mg | ORAL_CAPSULE | Freq: Two times a day (BID) | ORAL | 1 refills | Status: DC

## 2021-01-16 NOTE — Progress Notes (Signed)
  Subjective:  Patient ID: Rose Bradley, female    DOB: 10/11/1996,  MRN: [id number] HPI Chief Complaint  Patient presents with  . Foot Pain    Dorsal/lateral foot left - aching x 2-3 weeks, no recent injury, did have old softball injury to same spot 6 years ago, tried Advil  . New Patient (Initial Visit)    Est pt 2016    25 y.o. female presents with the above complaint.   ROS: Denies fever chills nausea vomiting muscle aches pains calf pain back pain chest pain shortness of breath.  Past Medical History:  Diagnosis Date  . Murmur    Past Surgical History:  Procedure Laterality Date  . NO PAST SURGERIES    . TURBINATE REDUCTION Bilateral 02/23/2018   Procedure: BILATERAL TURBINATE REDUCTION;  Surgeon: Newman Pies, MD;  Location: [facility];  Service: ENT;  Laterality: Bilateral;    Current Outpatient Medications:  .  celecoxib (CELEBREX) 200 MG capsule, Take 1 capsule (200 mg total) by mouth 2 (two) times daily., Disp: 60 capsule, Rfl: 1 .  metoprolol succinate (TOPROL-XL) 25 MG 24 hr tablet, Take 25 mg by mouth daily., Disp: , Rfl:   Allergies  Allergen Reactions  . Prednisone Nausea And Vomiting  . Amoxicillin Nausea And Vomiting  . Penicillin G     Other reaction(s): Unknown  . Penicillins Other (See Comments)    unknown   Review of Systems Objective:  There were no vitals filed for this visit.  General: Well developed, nourished, in no acute distress, alert and oriented x3   Dermatological: Skin is warm, dry and supple bilateral. Nails x 10 are well maintained; remaining integument appears unremarkable at this time. There are no open sores, no preulcerative lesions, no rash or signs of infection present.  Vascular: Dorsalis Pedis artery and Posterior Tibial artery pedal pulses are 2/4 bilateral with immedate capillary fill time. Pedal hair growth present. No varicosities and no lower extremity edema present bilateral.   Neruologic: Grossly intact via  light touch bilateral. Vibratory intact via tuning fork bilateral. Protective threshold with Semmes Wienstein monofilament intact to all pedal sites bilateral. Patellar and Achilles deep tendon reflexes 2+ bilateral. No Babinski or clonus noted bilateral.   Musculoskeletal: No gross boney pedal deformities bilateral. No pain, crepitus, or limitation noted with foot and ankle range of motion bilateral. Muscular strength 5/5 in all groups tested bilateral.  She has pain on palpation to the fourth fifth tarsometatarsal joint but even worse pain on abduction against resistance and pain on palpation near the insertion site of the peroneus brevis on the fifth metatarsal left.  Minimal edema.  No ecchymosis.  Gait: Unassisted, Nonantalgic.    Radiographs:  Radiographs demonstrate today no osseous abnormalities.  No acute findings.  Osseously mature individual.  Assessment & Plan:   Assessment: Possible sprain of the fourth fifth tarsometatarsal joint and moderate to severe tendinitis of the peroneus brevis at its insertion laterally.    Plan: Discussed etiology pathology conservative versus surgical therapies.  At this point we will put her in a short cam walker and start her on a anti-inflammatory.  Celebrex 200 mg 1 p.o. twice daily I will follow-up with her in 1 month.  Questions or concerns or any side effects of medication she will notify [place] immediately.     Casy Brunetto T. [place], [place]

## 2021-02-20 ENCOUNTER — Ambulatory Visit: Payer: 59 | Admitting: Podiatry

## 2021-03-06 ENCOUNTER — Ambulatory Visit: Payer: 59 | Admitting: Podiatry

## 2022-01-20 ENCOUNTER — Ambulatory Visit (INDEPENDENT_AMBULATORY_CARE_PROVIDER_SITE_OTHER): Payer: No Typology Code available for payment source

## 2022-01-20 ENCOUNTER — Encounter: Payer: Self-pay | Admitting: Emergency Medicine

## 2022-01-20 ENCOUNTER — Ambulatory Visit
Admission: EM | Admit: 2022-01-20 | Discharge: 2022-01-20 | Disposition: A | Discharge status: Home / Self Care | Payer: No Typology Code available for payment source | Attending: Emergency Medicine | Admitting: Emergency Medicine

## 2022-01-20 DIAGNOSIS — R059 Cough, unspecified: Secondary | ICD-10-CM

## 2022-01-20 DIAGNOSIS — R058 Other specified cough: Secondary | ICD-10-CM

## 2022-01-20 DIAGNOSIS — J302 Other seasonal allergic rhinitis: Secondary | ICD-10-CM

## 2022-01-20 DIAGNOSIS — J069 Acute upper respiratory infection, unspecified: Secondary | ICD-10-CM

## 2022-01-20 NOTE — ED Triage Notes (Signed)
Pt presents with cough, runny nose, and fatigue x 4 days and a fever that started last night.  ?

## 2022-01-20 NOTE — ED Provider Notes (Signed)
?[facility] ? ? ? ?CSN: [id number] ?Arrival date & time: 01/20/22  1242 ? ? ?  ? ?History   ?Chief Complaint ?Chief Complaint  ?Patient presents with  ? Cough  ? Fatigue  ? Nasal Congestion  ? Fever  ? ? ?HPI ?Rose Bradley is a 26 y.o. female.  Patient presents with 4-day history of cough productive of green-yellow sputum, runny nose, postnasal drip, sinus congestion, fatigue.  She developed low grade fever of 100 yesterday.  Treating at home with Dayquil and Tylenol.  She denies rash, earache, sore throat, shortness of breath, vomiting, diarrhea, or other symptoms.  No pertinent medical history.  Patient is concerned for possibility of pneumonia because her roommate has pneumonia. ? ?The history is provided by the patient.  ? ?Past Medical History:  ?Diagnosis Date  ? Murmur   ? ? ?There are no problems to display for this patient. ? ? ?Past Surgical History:  ?Procedure Laterality Date  ? NO PAST SURGERIES    ? TURBINATE REDUCTION Bilateral 02/23/2018  ? Procedure: BILATERAL TURBINATE REDUCTION;  Surgeon: Leta Baptist, MD;  Location: [place];  Service: ENT;  Laterality: Bilateral;  ? ? ?OB History   ? ? Gravida  ?0  ? Para  ?0  ? Term  ?0  ? Preterm  ?0  ? AB  ?0  ? Living  ?0  ?  ? ? SAB  ?0  ? IAB  ?0  ? Ectopic  ?0  ? Multiple  ?0  ? Live Births  ?   ?   ?  ?  ? ? ? ?Home Medications   ? ?Prior to Admission medications   ?Medication Sig Start Date End Date Taking? Authorizing Provider  ?celecoxib (CELEBREX) 200 MG capsule Take 1 capsule (200 mg total) by mouth 2 (two) times daily. 01/16/21   Hyatt, Max T, DPM  ?metoprolol succinate (TOPROL-XL) 25 MG 24 hr tablet Take 25 mg by mouth daily. 12/27/20   [provider]  ? ? ?Family History ?Family History  ?Problem Relation Age of Onset  ? Hypertension Father   ? Heart disease Maternal Grandfather   ? ? ?Social History ?Social History  ? ?Tobacco Use  ? Smoking status: Never  ? Smokeless tobacco: Never  ?Vaping Use  ? Vaping Use: Never  used  ?Substance Use Topics  ? Alcohol use: No  ?  Alcohol/week: 0.0 standard drinks  ? Drug use: No  ? ? ? ?Allergies   ?Prednisone, Amoxicillin, Duloxetine hcl, Penicillin g, and Penicillins ? ? ?Review of Systems ?Review of Systems  ?Constitutional:  Positive for fatigue and fever. Negative for chills.  ?HENT:  Positive for congestion, postnasal drip, rhinorrhea and sinus pressure. Negative for ear pain and sore throat.   ?Respiratory:  Positive for cough. Negative for shortness of breath.   ?Cardiovascular:  Negative for chest pain and palpitations.  ?Gastrointestinal:  Negative for diarrhea and vomiting.  ?Skin:  Negative for color change and rash.  ?All other systems reviewed and are negative. ? ? ?Physical Exam ?Triage Vital Signs ?ED Triage Vitals  ?Enc Vitals Group  ?   BP   ?   Pulse   ?   Resp   ?   Temp   ?   Temp src   ?   SpO2   ?   Weight   ?   Height   ?   Head Circumference   ?   Peak Flow   ?  Pain Score   ?   Pain Loc   ?   Pain Edu?   ?   Excl. in [place]?   ? ?No data found. ? ?Updated Vital Signs ?BP 134/83   Pulse 92   Temp 98.8 ?F (37.1 ?C)   Resp 18   LMP 01/14/2022   SpO2 96%  ? ?Visual Acuity ?Right Eye Distance:   ?Left Eye Distance:   ?Bilateral Distance:   ? ?Right Eye Near:   ?Left Eye Near:    ?Bilateral Near:    ? ?Physical Exam ?Vitals and nursing note reviewed.  ?Constitutional:   ?   General: She is not in acute distress. ?   Appearance: She is well-developed. She is obese. She is not ill-appearing.  ?HENT:  ?   Right Ear: Tympanic membrane normal.  ?   Left Ear: Tympanic membrane normal.  ?   Nose: Congestion and rhinorrhea present.  ?   Mouth/Throat:  ?   Mouth: Mucous membranes are moist.  ?   Pharynx: Oropharynx is clear.  ?Cardiovascular:  ?   Rate and Rhythm: Normal rate and regular rhythm.  ?   Heart sounds: Normal heart sounds.  ?Pulmonary:  ?   Effort: Pulmonary effort is normal. No respiratory distress.  ?   Breath sounds: Normal breath sounds.  ?Abdominal:  ?    Tenderness: There is no abdominal tenderness. There is no guarding.  ?Musculoskeletal:  ?   Cervical back: Neck supple.  ?Skin: ?   General: Skin is warm and dry.  ?Neurological:  ?   Mental Status: She is alert.  ?Psychiatric:     ?   Mood and Affect: Mood normal.     ?   Behavior: Behavior normal.  ? ? ? ?UC Treatments / Results  ?Labs ?(all labs ordered are listed, but only abnormal results are displayed) ?Labs Reviewed - No data to display ? ?EKG ? ? ?Radiology ?DG Chest 2 View ? ?Result Date: 01/20/2022 ?CLINICAL DATA:  Productive cough EXAM: CHEST - 2 VIEW COMPARISON:  None. FINDINGS: Cardiac size is within normal limits. There are no signs of pulmonary edema or focal pulmonary consolidation. There is no pleural effusion or pneumothorax. There is mild dextroscoliosis in the thoracic spine. IMPRESSION: No active cardiopulmonary disease. Electronically Signed   By: Elmer Picker M.D.   On: 01/20/2022 13:44   ? ?Procedures ?Procedures (including critical care time) ? ?Medications Ordered in UC ?Medications - No data to display ? ?Initial Impression / Assessment and Plan / UC Course  ?I have reviewed the triage vital signs and the nursing notes. ? ?Pertinent labs & imaging results that were available during my care of the patient were reviewed by me and considered in my medical decision making (see chart for details). ? ?  ?Productive cough, viral URI.  Chest x-ray normal.  Patient declines COVID test.  Discussed symptomatic treatment including Tylenol or ibuprofen, Mucinex, rest, hydration.  Instructed patient to follow up with her PCP if her symptoms are not improving.  She agrees to plan of care.  ? ? ?Final Clinical Impressions(s) / UC Diagnoses  ? ?Final diagnoses:  ?Productive cough  ?Viral URI  ? ? ? ?Discharge Instructions   ? ?  ?Your chest xray is normal.   ? ?Take Tylenol or ibuprofen as needed for fever or discomfort.  Take plain Mucinex as needed for congestion.  Rest and keep yourself hydrated.    ? ?Follow up with your primary care provider  if your symptoms are not improving.   ? ? ? ? ? ?ED Prescriptions   ?None ?  ? ?PDMP not reviewed this encounter. ?  ?Sharion Balloon, NP ?01/20/22 1400 ? ?

## 2022-01-20 NOTE — Discharge Instructions (Addendum)
Your chest xray is normal.   ? ?Take Tylenol or ibuprofen as needed for fever or discomfort.  Take plain Mucinex as needed for congestion.  Rest and keep yourself hydrated.   ? ?Follow up with your primary care provider if your symptoms are not improving.   ? ?

## 2022-04-21 ENCOUNTER — Emergency Department
Admission: EM | Admit: 2022-04-21 | Discharge: 2022-04-21 | Disposition: A | Discharge status: Home / Self Care | Payer: No Typology Code available for payment source | Attending: Emergency Medicine | Admitting: Emergency Medicine

## 2022-04-21 ENCOUNTER — Other Ambulatory Visit: Payer: Self-pay

## 2022-04-21 DIAGNOSIS — Y93E5 Activity, floor mopping and cleaning: Secondary | ICD-10-CM | POA: Diagnosis not present

## 2022-04-21 DIAGNOSIS — S61411A Laceration without foreign body of right hand, initial encounter: Secondary | ICD-10-CM

## 2022-04-21 DIAGNOSIS — S6991XA Unspecified injury of right wrist, hand and finger(s), initial encounter: Secondary | ICD-10-CM | POA: Diagnosis present

## 2022-04-21 DIAGNOSIS — W260XXA Contact with knife, initial encounter: Secondary | ICD-10-CM | POA: Insufficient documentation

## 2022-04-21 MED ORDER — LIDOCAINE HCL (PF) 1 % IJ SOLN
5.0000 mL | Freq: Once | INTRAMUSCULAR | Status: DC
  Filled 2022-04-21: qty 5

## 2022-04-21 NOTE — Discharge Instructions (Addendum)
-  Please return to the emergency department or urgent care for suture removal in 7 days.  Keep the sutures covered with Band-Aid or dressing throughout this time.  You may wash the affected area with soap and water daily.  Recommend applying a topical antibiotic ointment daily as well.  -You may take Tylenol/ibuprofen as needed for pain.  -Return to the emergency department anytime if you begin to experience any new or worsening symptoms.

## 2022-04-21 NOTE — ED Provider Notes (Signed)
[facility] Provider Note    Event Date/Time   First MD Initiated Contact with Patient 04/21/22 2131     (approximate)   History   Chief Complaint Puncture Wound   HPI Rose Bradley is a 26 y.o. female, no remarkable medical history, presents emergency department for evaluation of laceration.  Patient states that she was trying to clean out a wax former with a kitchen knife when she accidentally stabbed her right hand, palmar aspect.  Bleeding was controlled with direct pressure.  She treated herself with Steri-Strips and dressed at home.  However, she endorses some numbness and limited strength in the right thumb.  Denies any other injuries or recent illnesses.  She states that she is unsure about her tetanus status, but does not want tetanus booster at this time.  History Limitations: No limitations.        Physical Exam  Triage Vital Signs: ED Triage Vitals  Enc Vitals Group     BP 04/21/22 2121 (!) 146/89     Pulse Rate 04/21/22 2121 69     Resp 04/21/22 2121 20     Temp 04/21/22 2121 98.7 F (37.1 C)     Temp Source 04/21/22 2121 Oral     SpO2 04/21/22 2121 100 %     Weight 04/21/22 2122 270 lb (122.5 kg)     Height 04/21/22 2122 5\' 7"  (1.702 m)     Head Circumference --      Peak Flow --      Pain Score 04/21/22 2122 10     Pain Loc --      Pain Edu? --      Excl. in GC? --     Most recent vital signs: Vitals:   04/21/22 2121  BP: (!) 146/89  Pulse: 69  Resp: 20  Temp: 98.7 F (37.1 C)  SpO2: 100%    General: Awake, NAD.  Skin: Warm, dry. No rashes or lesions.  Eyes: PERRL. Conjunctivae normal.  CV: Good peripheral perfusion.  Resp: Normal effort.  Abd: Soft, non-tender. No distention.  Neuro: At baseline. No gross neurological deficits.   Focused Exam: 1.5 cm laceration along the thenar eminence of the right hand.  No active bleeding or discharge.  No surrounding warmth or erythema.  No obvious foreign bodies noted.   Patient maintains sensation in all digits.  Range of motion intact in all digits, including flexion extension, though is notably weak in the right thumb, likely due to pain.  Physical Exam    ED Results / Procedures / Treatments  Labs (all labs ordered are listed, but only abnormal results are displayed) Labs Reviewed - No data to display   EKG N/A.   RADIOLOGY  ED Provider Interpretation: N/A.  No results found.  PROCEDURES:  Critical Care performed: N/A.  2122.Laceration Repair  Date/Time: 04/21/2022 10:28 PM  Performed by: 06/22/2022, PA Authorized by: Varney Daily, PA   Consent:    Consent obtained:  Verbal   Consent given by:  Patient   Risks discussed:  Infection, pain and retained foreign body   Alternatives discussed:  No treatment Universal protocol:    Patient identity confirmed:  Verbally with patient Anesthesia:    Anesthesia method:  Local infiltration   Local anesthetic:  Lidocaine 1% w/o epi Laceration details:    Location:  Hand   Hand location:  R palm   Length (cm):  1.5   Depth (mm):  3 Pre-procedure details:  Preparation:  Patient was prepped and draped in usual sterile fashion Exploration:    Hemostasis achieved with:  Direct pressure   Imaging outcome: foreign body not noted     Wound exploration: wound explored through full range of motion and entire depth of wound visualized     Wound extent: no foreign bodies/material noted, no tendon damage noted and no vascular damage noted   Treatment:    Area cleansed with:  Soap and water   Amount of cleaning:  Extensive   Irrigation solution:  Tap water   Irrigation volume:  3000 ml   Irrigation method:  Tap Skin repair:    Repair method:  Sutures   Suture size:  5-0   Suture material:  Prolene   Suture technique:  Simple interrupted   Number of sutures:  3 Approximation:    Approximation:  Close Repair type:    Repair type:  Simple Post-procedure details:     Dressing:  Sterile dressing   Procedure completion:  Tolerated well, no immediate complications     MEDICATIONS ORDERED IN ED: Medications  lidocaine (PF) (XYLOCAINE) 1 % injection 5 mL (has no administration in time range)     IMPRESSION / MDM / ASSESSMENT AND PLAN / ED COURSE  I reviewed the triage vital signs and the nursing notes.                              Differential diagnosis includes, but is not limited to, laceration/puncture wound, vascular injury, flexor tendon injury.  ED Course Patient appears well, vitals within normal limits.  NAD.  Assessment/Plan Patient presents with laceration to the thenar eminence of her right hand.  No vascular injury noted.  Sensation intact.  No evidence of foreign body on examination.  No evidence of any significant tendon injury, as she still maintains some range of motion.  I suspect that her limited range of motion is likely due to pain and inflammation.  We will provide her with a referral to orthopedics if her range of motion still limited after suture removal.  No signs of infection.  She states that the knife was brand-new and very clean.  I do not believe antibiotics are needed at this time.  We will plan to discharge.  Patient's presentation is most consistent with acute, uncomplicated illness.   Provided the patient with anticipatory guidance, return precautions, and educational material. Encouraged the patient to return to the emergency department at any time if they begin to experience any new or worsening symptoms. Patient expressed understanding and agreed with the plan.       FINAL CLINICAL IMPRESSION(S) / ED DIAGNOSES   Final diagnoses:  Laceration of right hand without foreign body, initial encounter     Rx / DC Orders   ED Discharge Orders     None        Note:  This document was prepared using Dragon voice recognition software and may include unintentional dictation errors.   Varney Daily,  [place] 04/21/22 2231    Shaune Pollack, MD 04/23/22 [phone]

## 2022-04-21 NOTE — ED Triage Notes (Addendum)
Pt was cleaning a wax warmer when knife punctured her right hand. Pt has limited movement to right thumb, steri strips noted, pt states she dressed it at home.

## 2022-05-12 ENCOUNTER — Other Ambulatory Visit: Payer: Self-pay

## 2022-05-12 ENCOUNTER — Emergency Department: Payer: No Typology Code available for payment source

## 2022-05-12 ENCOUNTER — Emergency Department
Admission: EM | Admit: 2022-05-12 | Discharge: 2022-05-12 | Disposition: A | Discharge status: Home / Self Care | Payer: No Typology Code available for payment source | Attending: Student in an Organized Health Care Education/Training Program | Admitting: Student in an Organized Health Care Education/Training Program

## 2022-05-12 ENCOUNTER — Encounter: Payer: Self-pay | Admitting: Emergency Medicine

## 2022-05-12 DIAGNOSIS — M25512 Pain in left shoulder: Secondary | ICD-10-CM | POA: Diagnosis present

## 2022-05-12 MED ORDER — LIDOCAINE 5 % EX PTCH
1.0000 | MEDICATED_PATCH | CUTANEOUS | Status: DC
  Administered 2022-05-12: 1 via TRANSDERMAL
  Filled 2022-05-12: qty 1

## 2022-05-12 MED ORDER — ACETAMINOPHEN 325 MG PO TABS
650.0000 mg | ORAL_TABLET | Freq: Once | ORAL | Status: AC
  Administered 2022-05-12: 650 mg via ORAL
  Filled 2022-05-12: qty 2

## 2022-05-12 MED ORDER — LIDOCAINE 5 % EX PTCH: 1.0000 | MEDICATED_PATCH | Freq: Two times a day (BID) | CUTANEOUS | 0 refills | Status: DC
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
## 2022-05-14 ENCOUNTER — Ambulatory Visit
Admission: EM | Admit: 2022-05-14 | Discharge: 2022-05-14 | Disposition: A | Discharge status: Home / Self Care | Payer: No Typology Code available for payment source | Attending: Family Medicine | Admitting: Family Medicine

## 2022-05-14 DIAGNOSIS — S46912A Strain of unspecified muscle, fascia and tendon at shoulder and upper arm level, left arm, initial encounter: Secondary | ICD-10-CM

## 2022-05-14 MED ORDER — TIZANIDINE HCL 2 MG PO TABS: 2.0000 mg | ORAL_TABLET | Freq: Three times a day (TID) | ORAL | 0 refills | Status: DC | PRN

## 2022-05-14 MED ORDER — INDOMETHACIN 50 MG PO CAPS: 50.0000 mg | ORAL_CAPSULE | Freq: Two times a day (BID) | ORAL | 0 refills | Status: AC

## 2022-05-14 NOTE — Discharge Instructions (Signed)
While taking indomethacin avoid taking any elderly, Excedrin, aspirin, or ibuprofen. Take indomethacin as prescribed and take consistently for the next 5 to 7 days.  If you continue to have shoulder pain following completion of this medication follow-up at [facility] for further work-up and evaluation of your shoulder pain.  Resume wearing her shoulder immobilizer take off intermittently to stretch and rotate shoulder as tolerated.Apply heat to the areas of the shoulder which are painful with a heating pad or warm compresses as tolerated. You do not need to wear immobilizer at bedtime.
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
## 2022-05-27 IMAGING — DX DG CHEST 2V
2 series · 2 of 2 positions shown · non-contrast
Comparison: None.

CLINICAL DATA: Productive cough

EXAM:
CHEST - 2 VIEW

[chest pa]
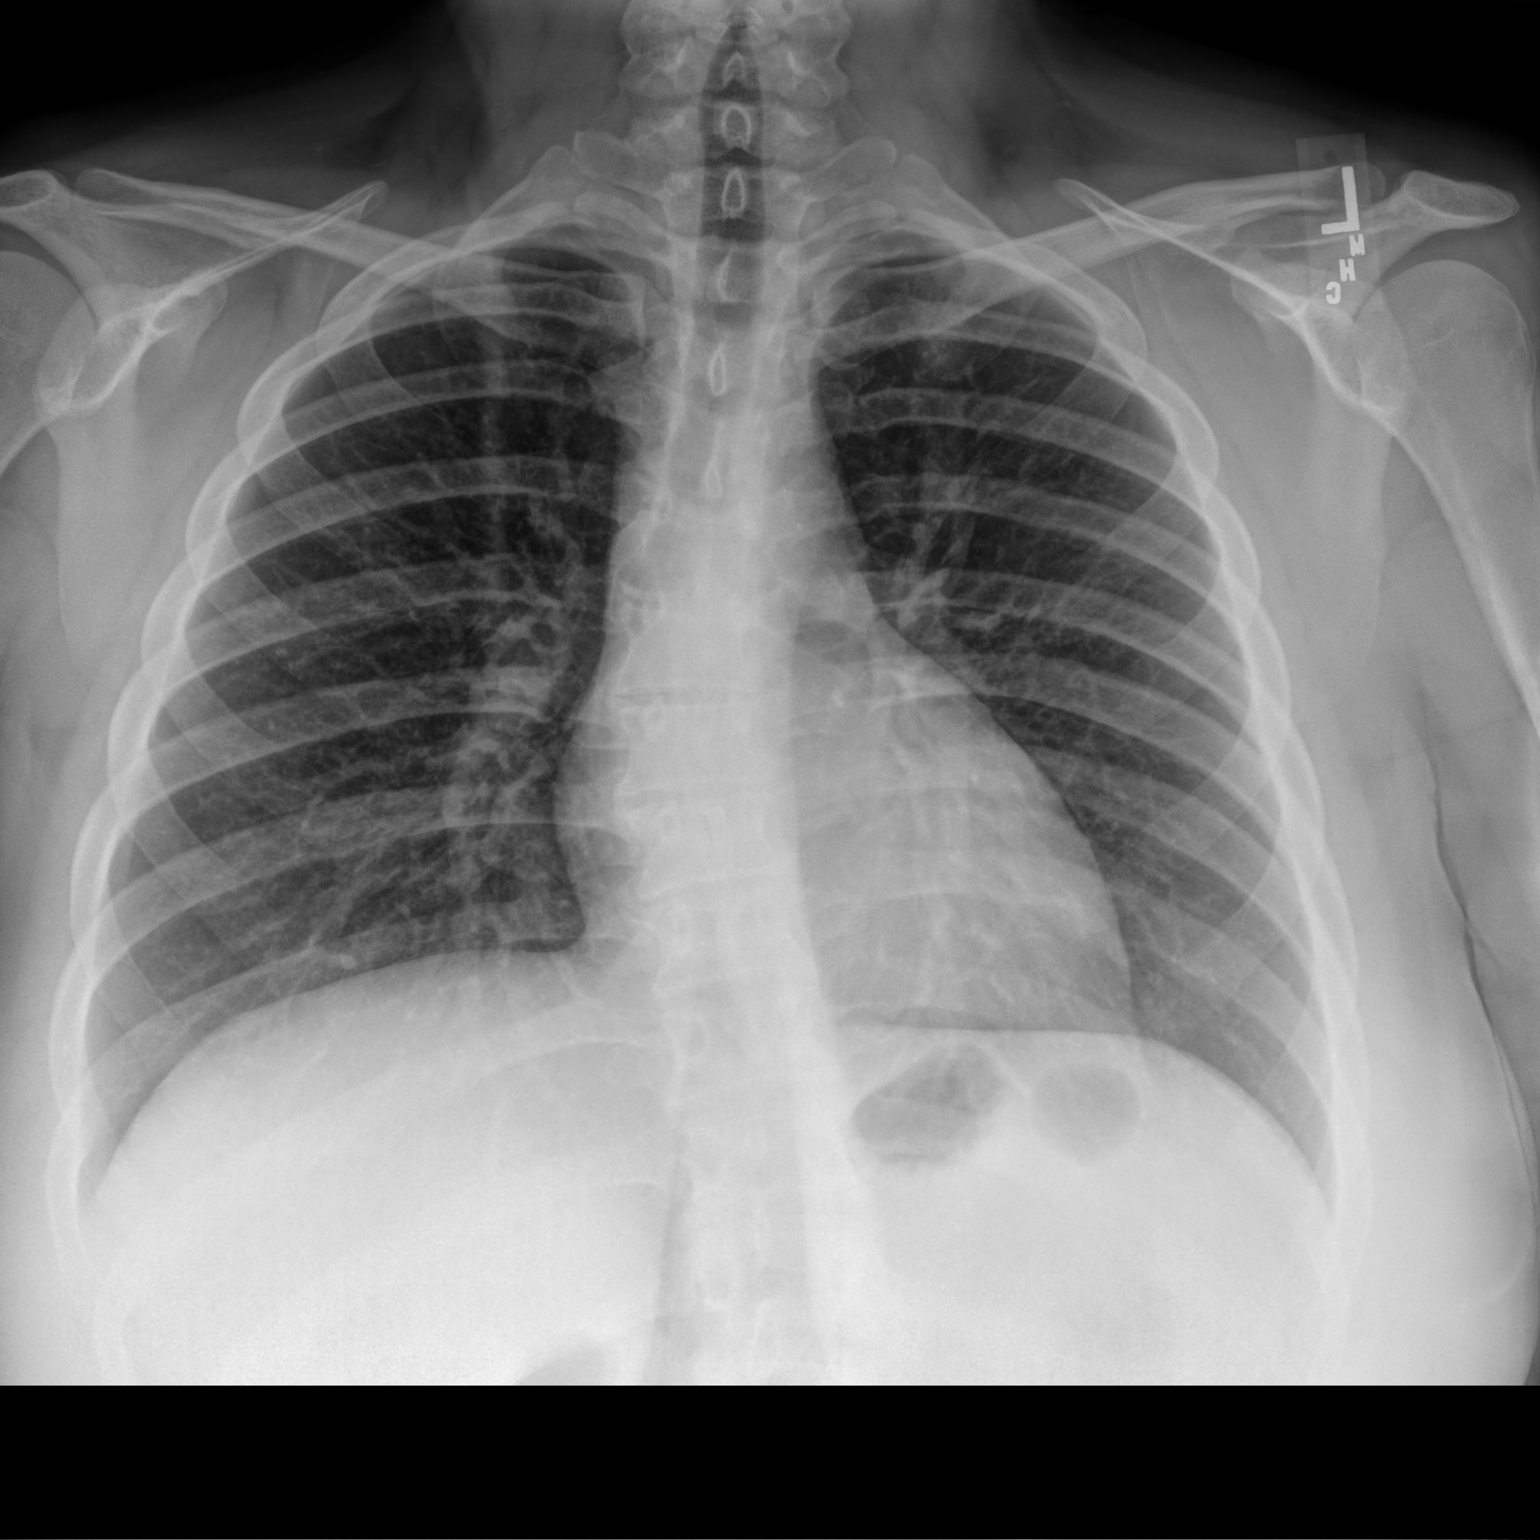

[chest lat]
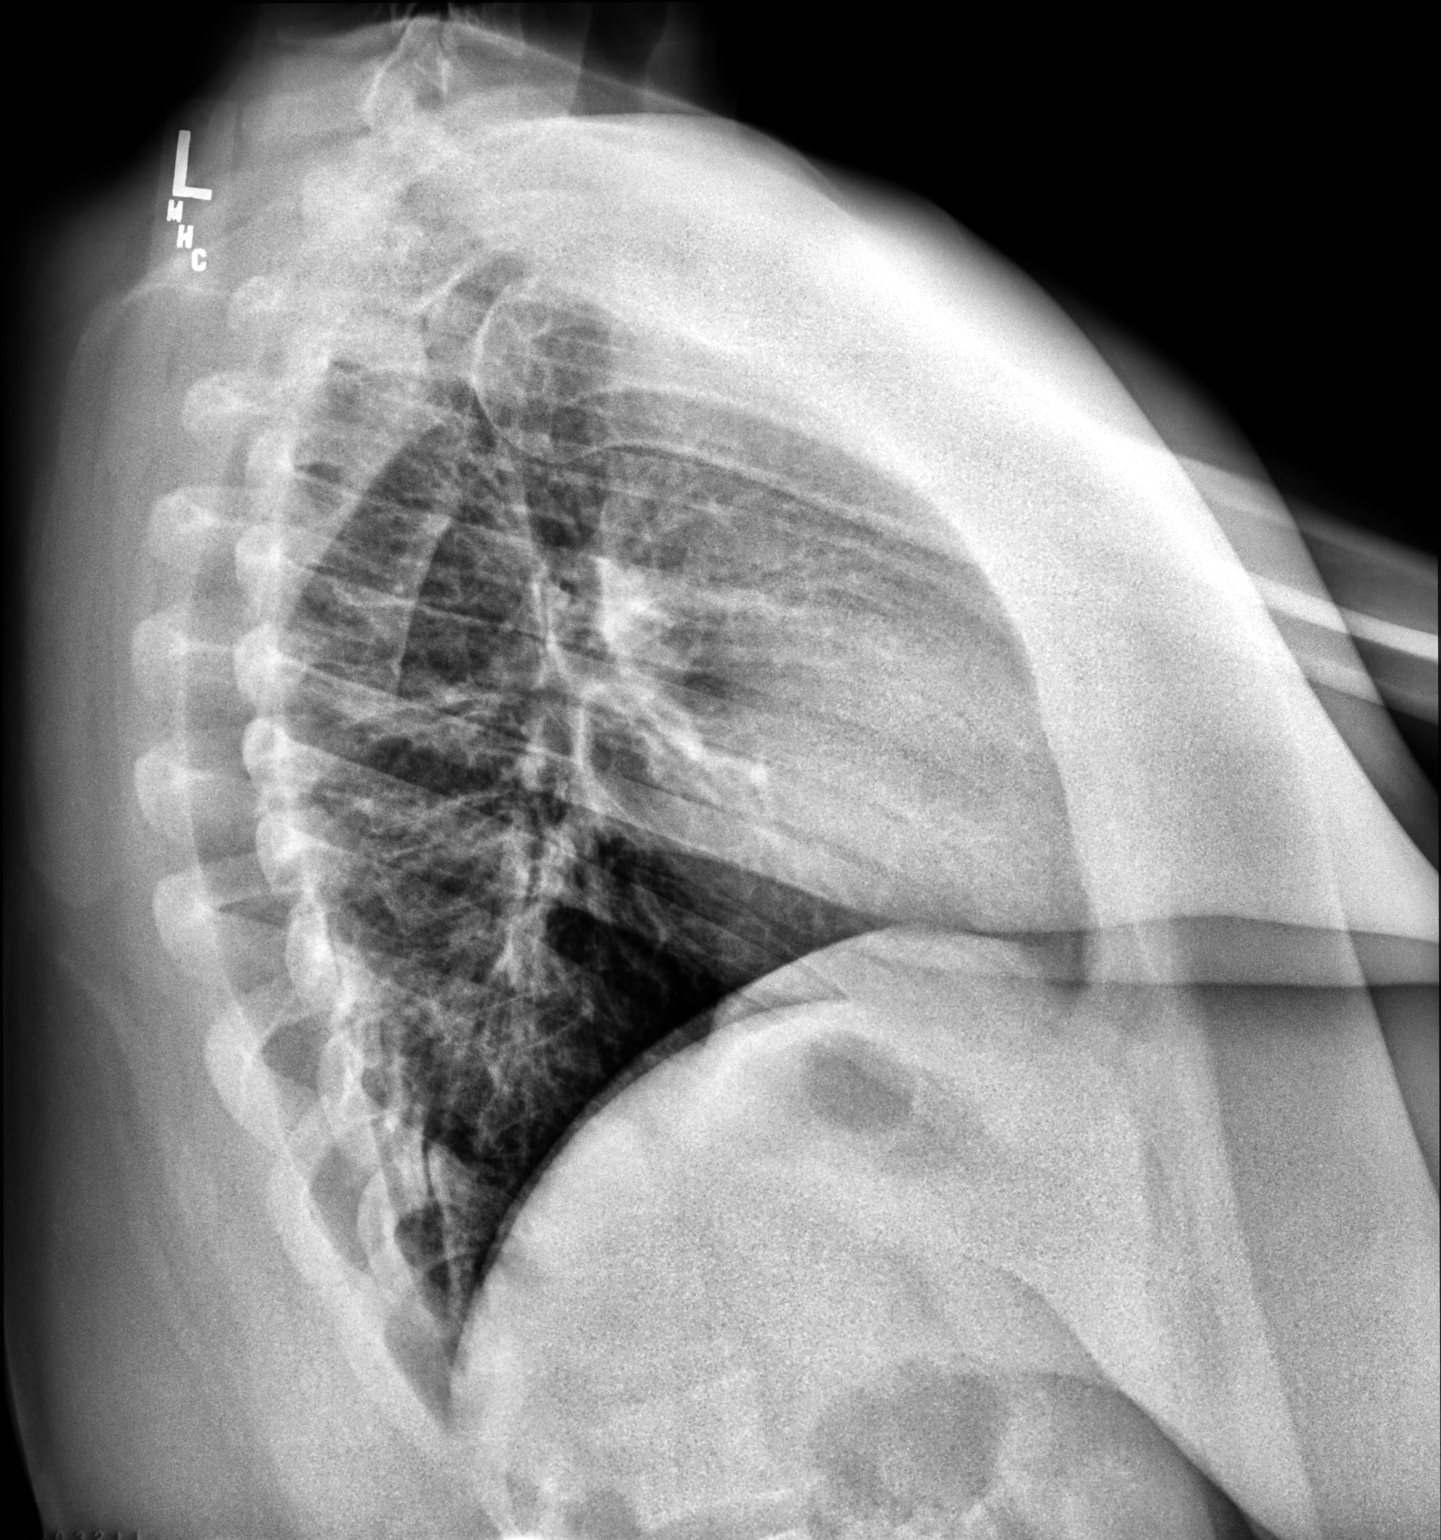

[2 of 2 positions shown; findings below may reference images not displayed]

FINDINGS: Cardiac size is within normal limits. There are no signs of
pulmonary edema or focal pulmonary consolidation. There is no
pleural effusion or pneumothorax. There is mild dextroscoliosis in
the thoracic spine.
IMPRESSION: No active cardiopulmonary disease.

## 2022-09-09 ENCOUNTER — Other Ambulatory Visit: Payer: Self-pay | Admitting: Certified Nurse Midwife

## 2022-09-09 DIAGNOSIS — Z3689 Encounter for other specified antenatal screening: Secondary | ICD-10-CM

## 2022-09-29 ENCOUNTER — Other Ambulatory Visit: Payer: No Typology Code available for payment source

## 2022-10-09 ENCOUNTER — Ambulatory Visit: Payer: No Typology Code available for payment source | Attending: Obstetrics

## 2022-10-09 ENCOUNTER — Other Ambulatory Visit: Payer: Self-pay

## 2022-10-09 ENCOUNTER — Ambulatory Visit (INDEPENDENT_AMBULATORY_CARE_PROVIDER_SITE_OTHER): Payer: No Typology Code available for payment source | Admitting: Obstetrics

## 2022-10-09 DIAGNOSIS — Z363 Encounter for antenatal screening for malformations: Secondary | ICD-10-CM | POA: Insufficient documentation

## 2022-10-09 DIAGNOSIS — M419 Scoliosis, unspecified: Secondary | ICD-10-CM | POA: Insufficient documentation

## 2022-10-09 DIAGNOSIS — E669 Obesity, unspecified: Secondary | ICD-10-CM

## 2022-10-09 DIAGNOSIS — O26892 Other specified pregnancy related conditions, second trimester: Secondary | ICD-10-CM | POA: Insufficient documentation

## 2022-10-09 DIAGNOSIS — Z3A19 19 weeks gestation of pregnancy: Secondary | ICD-10-CM | POA: Diagnosis not present

## 2022-10-09 DIAGNOSIS — O35BXX0 Maternal care for other (suspected) fetal abnormality and damage, fetal cardiac anomalies, not applicable or unspecified: Secondary | ICD-10-CM

## 2022-10-09 DIAGNOSIS — Z3689 Encounter for other specified antenatal screening: Secondary | ICD-10-CM

## 2022-10-09 DIAGNOSIS — O99212 Obesity complicating pregnancy, second trimester: Secondary | ICD-10-CM

## 2022-10-09 NOTE — Progress Notes (Signed)
MFM Note   Rose Bradley was seen for a detailed fetal anatomy scan due to maternal obesity with a BMI of 45.   She denies any significant past medical history and denies any problems in her current pregnancy.    She had a cell free DNA test earlier in her pregnancy which indicated a low risk for trisomy 39, 29, and 13. A female fetus is predicted.   She was informed that the fetal growth and amniotic fluid level were appropriate for her gestational age.   On today's exam, an intracardiac echogenic focus was noted in the left ventricle of the fetal heart.  The small association between an echogenic focus and Down syndrome was discussed.   Due to the echogenic focus noted today, the patient was offered and declined an amniocentesis today for definitive diagnosis of fetal aneuploidy.  She reports that she is comfortable with her negative cell free DNA test.  The views of the fetal anatomy were limited today due to maternal body habitus and the fetal position.  The patient was informed that anomalies may be missed due to technical limitations. If the fetus is in a suboptimal position or maternal habitus is increased, visualization of the fetus in the maternal uterus may be impaired.  The following were discussed during today's consultation:  Maternal obesity in pregnancy  The recommended total weight gain in pregnancy for obese women's between 10 to 20 pounds.  Due to obesity, she should be screened for gestational diabetes at between 24 to 28 weeks.    We will continue to follow her with monthly growth ultrasounds throughout her pregnancy.  As her BMI is greater than 40, weekly fetal testing should be started at around 34 weeks.   As maternal obesity may present challenges associated with the management of anesthesia, an anesthesia consult should be obtained when she is admitted in labor.  She should continue taking a daily baby aspirin (81 mg daily) for preeclampsia prophylaxis.  A  follow-up exam was scheduled in 4 weeks to assess the fetal growth and to reassess the views of the fetal anatomy.  The patient stated that all of her questions were answered today.  A total of 45 minutes was spent counseling and coordinating the care for this patient.   Greater than 50% of the time was spent in direct face-to-face contact.

## 2022-10-20 NOTE — L&D Delivery Note (Addendum)
Delivery Note  First Stage: Labor onset: 02/27/23 @ 1704 Augmentation : cytotec, pitocin, AROM Analgesia Eliezer Lofts intrapartum: epidural AROM at 02/27/23 @ 1704  Second Stage: Complete dilation at 0941 Onset of pushing at 0948 FHR second stage Category II, intermittent variable decelerations, brief fetal tachycardia She pushed for about then was fatigued and requested a break. She rested about one hour then resumed pushing for and delivered.   Delivery of a viable female infant 02/28/2023 at 1329 by Donato Schultz, CNM. delivery of fetal head in OA position with restitution to ROT. Loose nuchal cord reduced prior to delivery;  Anterior then posterior shoulders delivered easily with gentle downward traction. Baby placed on mom's chest, and attended to by peds.  Cord double clamped after cessation of pulsation, cut by FOB Cord blood sample collected  Venous cord blood sample collected  Third Stage: Placenta delivered Tomasa Blase intact with 3 VC @ 1352 Placenta disposition: discarded Uterine tone firm with uterine sweep / bleeding brisk  She experienced uterine atony, received pitocin IM. Manual sweep removed many blood clots. Several minutes later she experienced uterine atony again and did a second manual sweep. Pitocin IV and cytotec rectal placed. Uterus filled with clots a third time. Manual sweep performed and decision made to place [place] device. Jada device placed without difficulty. Dr. Feliberto Gottron notified of [facility] device placement.  Patient received 2g Ancef IV for postpartum endometritis prevention.  1st degree laceration identified  Anesthesia for repair: epidural Repair 2-0 Vicryl Est. Blood Loss (mL):  Complications: postpartum hemorrhage/prolonged second stage  Mom to postpartum.  Baby to Couplet care / Skin to Skin.  Newborn: Birth Weight: 7lb 14.3oz  Apgar Scores: 6, 9 Feeding planned: breastfeeding

## 2022-11-04 ENCOUNTER — Other Ambulatory Visit: Payer: Self-pay

## 2022-11-04 DIAGNOSIS — O99212 Obesity complicating pregnancy, second trimester: Secondary | ICD-10-CM

## 2022-11-04 DIAGNOSIS — M419 Scoliosis, unspecified: Secondary | ICD-10-CM

## 2022-11-06 ENCOUNTER — Ambulatory Visit: Payer: No Typology Code available for payment source | Attending: Maternal & Fetal Medicine

## 2022-11-06 ENCOUNTER — Other Ambulatory Visit: Payer: Self-pay

## 2022-11-06 DIAGNOSIS — O99212 Obesity complicating pregnancy, second trimester: Secondary | ICD-10-CM | POA: Insufficient documentation

## 2022-11-06 DIAGNOSIS — O99891 Other specified diseases and conditions complicating pregnancy: Secondary | ICD-10-CM | POA: Insufficient documentation

## 2022-11-06 DIAGNOSIS — M419 Scoliosis, unspecified: Secondary | ICD-10-CM | POA: Diagnosis not present

## 2022-11-06 DIAGNOSIS — Z362 Encounter for other antenatal screening follow-up: Secondary | ICD-10-CM

## 2022-11-06 DIAGNOSIS — O358XX0 Maternal care for other (suspected) fetal abnormality and damage, not applicable or unspecified: Secondary | ICD-10-CM | POA: Diagnosis not present

## 2022-11-06 DIAGNOSIS — E669 Obesity, unspecified: Secondary | ICD-10-CM

## 2022-11-06 DIAGNOSIS — Z3A23 23 weeks gestation of pregnancy: Secondary | ICD-10-CM | POA: Insufficient documentation

## 2022-12-03 ENCOUNTER — Other Ambulatory Visit: Payer: Self-pay | Admitting: Maternal & Fetal Medicine

## 2022-12-03 DIAGNOSIS — E668 Other obesity: Secondary | ICD-10-CM

## 2022-12-04 ENCOUNTER — Other Ambulatory Visit: Payer: Self-pay

## 2022-12-04 ENCOUNTER — Ambulatory Visit: Payer: No Typology Code available for payment source | Attending: Obstetrics

## 2022-12-04 DIAGNOSIS — O321XX0 Maternal care for breech presentation, not applicable or unspecified: Secondary | ICD-10-CM | POA: Insufficient documentation

## 2022-12-04 DIAGNOSIS — O99212 Obesity complicating pregnancy, second trimester: Secondary | ICD-10-CM | POA: Diagnosis not present

## 2022-12-04 DIAGNOSIS — O99891 Other specified diseases and conditions complicating pregnancy: Secondary | ICD-10-CM

## 2022-12-04 DIAGNOSIS — E668 Other obesity: Secondary | ICD-10-CM | POA: Insufficient documentation

## 2022-12-04 DIAGNOSIS — E669 Obesity, unspecified: Secondary | ICD-10-CM | POA: Diagnosis not present

## 2022-12-04 DIAGNOSIS — M419 Scoliosis, unspecified: Secondary | ICD-10-CM | POA: Diagnosis not present

## 2022-12-04 DIAGNOSIS — Z3A27 27 weeks gestation of pregnancy: Secondary | ICD-10-CM | POA: Diagnosis not present

## 2023-01-05 ENCOUNTER — Other Ambulatory Visit: Payer: Self-pay

## 2023-01-05 DIAGNOSIS — M419 Scoliosis, unspecified: Secondary | ICD-10-CM

## 2023-01-05 DIAGNOSIS — O99213 Obesity complicating pregnancy, third trimester: Secondary | ICD-10-CM

## 2023-01-07 ENCOUNTER — Other Ambulatory Visit: Payer: Self-pay

## 2023-01-07 ENCOUNTER — Ambulatory Visit: Payer: No Typology Code available for payment source | Attending: Maternal & Fetal Medicine

## 2023-01-07 DIAGNOSIS — E669 Obesity, unspecified: Secondary | ICD-10-CM

## 2023-01-07 DIAGNOSIS — M419 Scoliosis, unspecified: Secondary | ICD-10-CM | POA: Diagnosis not present

## 2023-01-07 DIAGNOSIS — O99213 Obesity complicating pregnancy, third trimester: Secondary | ICD-10-CM | POA: Diagnosis not present

## 2023-01-07 DIAGNOSIS — O99891 Other specified diseases and conditions complicating pregnancy: Secondary | ICD-10-CM

## 2023-01-07 DIAGNOSIS — Z3A31 31 weeks gestation of pregnancy: Secondary | ICD-10-CM | POA: Diagnosis present

## 2023-01-08 ENCOUNTER — Ambulatory Visit: Payer: No Typology Code available for payment source

## 2023-01-19 ENCOUNTER — Other Ambulatory Visit: Payer: Self-pay

## 2023-01-19 DIAGNOSIS — M419 Scoliosis, unspecified: Secondary | ICD-10-CM

## 2023-01-19 DIAGNOSIS — O99213 Obesity complicating pregnancy, third trimester: Secondary | ICD-10-CM

## 2023-01-21 ENCOUNTER — Other Ambulatory Visit: Payer: Self-pay

## 2023-01-21 ENCOUNTER — Ambulatory Visit: Payer: Medicaid Other | Attending: Maternal & Fetal Medicine

## 2023-01-21 DIAGNOSIS — O26893 Other specified pregnancy related conditions, third trimester: Secondary | ICD-10-CM | POA: Insufficient documentation

## 2023-01-21 DIAGNOSIS — O99891 Other specified diseases and conditions complicating pregnancy: Secondary | ICD-10-CM

## 2023-01-21 DIAGNOSIS — Z3A33 33 weeks gestation of pregnancy: Secondary | ICD-10-CM | POA: Insufficient documentation

## 2023-01-21 DIAGNOSIS — E669 Obesity, unspecified: Secondary | ICD-10-CM

## 2023-01-21 DIAGNOSIS — M419 Scoliosis, unspecified: Secondary | ICD-10-CM | POA: Diagnosis not present

## 2023-01-21 DIAGNOSIS — O99213 Obesity complicating pregnancy, third trimester: Secondary | ICD-10-CM | POA: Insufficient documentation

## 2023-01-22 ENCOUNTER — Other Ambulatory Visit: Payer: No Typology Code available for payment source

## 2023-01-26 ENCOUNTER — Other Ambulatory Visit: Payer: Self-pay

## 2023-01-26 DIAGNOSIS — O99213 Obesity complicating pregnancy, third trimester: Secondary | ICD-10-CM

## 2023-01-26 DIAGNOSIS — M419 Scoliosis, unspecified: Secondary | ICD-10-CM

## 2023-01-28 ENCOUNTER — Other Ambulatory Visit: Payer: Self-pay

## 2023-01-28 ENCOUNTER — Ambulatory Visit: Payer: Medicaid Other | Attending: Maternal & Fetal Medicine

## 2023-01-28 DIAGNOSIS — O99891 Other specified diseases and conditions complicating pregnancy: Secondary | ICD-10-CM | POA: Diagnosis not present

## 2023-01-28 DIAGNOSIS — Z3A34 34 weeks gestation of pregnancy: Secondary | ICD-10-CM | POA: Insufficient documentation

## 2023-01-28 DIAGNOSIS — E669 Obesity, unspecified: Secondary | ICD-10-CM | POA: Diagnosis not present

## 2023-01-28 DIAGNOSIS — O99213 Obesity complicating pregnancy, third trimester: Secondary | ICD-10-CM | POA: Insufficient documentation

## 2023-01-28 DIAGNOSIS — M419 Scoliosis, unspecified: Secondary | ICD-10-CM | POA: Insufficient documentation

## 2023-01-29 ENCOUNTER — Other Ambulatory Visit: Payer: No Typology Code available for payment source

## 2023-01-29 ENCOUNTER — Encounter
Admission: RE | Admit: 2023-01-29 | Discharge: 2023-01-29 | Disposition: A | Discharge status: Home / Self Care | Payer: Medicaid Other | Source: Ambulatory Visit

## 2023-01-29 NOTE — Consult Note (Signed)
[facility] Anesthesia Consultation  Meddie Venneman ACZ:[id number] DOB: Oct 11, 1996 DOA: 01/29/2023 PCP: Patient, No Pcp Per   Requesting physician: Rosana Hoes, MD  Date of consultation: 01/29/2023 Reason for consultation: Obesity during pregnancy  CHIEF COMPLAINT:  Obesity during pregnancy  HISTORY OF PRESENT ILLNESS: Rose Bradley  is a 27 y.o. female with a known history of scoliosis, BMI 49  PAST MEDICAL HISTORY:   Past Medical History:  Diagnosis Date   Murmur     PAST SURGICAL HISTORY:  Past Surgical History:  Procedure Laterality Date   NO PAST SURGERIES     TURBINATE REDUCTION Bilateral 02/23/2018   Procedure: BILATERAL TURBINATE REDUCTION;  Surgeon: Newman Pies, MD;  Location: [facility];  Service: ENT;  Laterality: Bilateral;    SOCIAL HISTORY:  Social History   Tobacco Use   Smoking status: Never   Smokeless tobacco: Never  Substance Use Topics   Alcohol use: No    Alcohol/week: 0.0 standard drinks of alcohol    FAMILY HISTORY:  Family History  Problem Relation Age of Onset   Hypertension Father    Heart disease Maternal Grandfather     DRUG ALLERGIES:  Allergies  Allergen Reactions   Prednisone Nausea And Vomiting   Amoxicillin Nausea And Vomiting   Duloxetine Hcl     Other reaction(s): felt bad   Penicillin G     Other reaction(s): Unknown   Penicillins Other (See Comments)    unknown    REVIEW OF SYSTEMS:   RESPIRATORY: No cough, shortness of breath, wheezing.  CARDIOVASCULAR: No chest pain, orthopnea, edema.  HEMATOLOGY: No anemia, easy bruising or bleeding SKIN: No rash or lesion. NEUROLOGIC: No tingling, numbness, weakness.  PSYCHIATRY: No anxiety or depression.   MEDICATIONS AT HOME:  Prior to Admission medications   Medication Sig Start Date End Date Taking? Authorizing Provider  aspirin EC 81 MG tablet Take 81 mg by mouth daily. Swallow whole.    [provider]  celecoxib  (CELEBREX) 200 MG capsule Take 1 capsule (200 mg total) by mouth 2 (two) times daily. Patient not taking: Reported on 10/09/2022 01/16/21   Hyatt, Max T, DPM  lidocaine (LIDODERM) 5 % Place 1 patch onto the skin every 12 (twelve) hours. Remove & Discard patch within 12 hours or as directed by MD Patient not taking: Reported on 10/09/2022 05/12/22 05/12/23  Willy Eddy, MD  metoprolol succinate (TOPROL-XL) 25 MG 24 hr tablet Take 25 mg by mouth daily. Patient not taking: Reported on 10/09/2022 12/27/20   [provider]  Prenatal Vit-Fe Fumarate-FA (PRENATAL MULTIVITAMIN) TABS tablet Take 1 tablet by mouth daily at 12 noon.    [provider]  tiZANidine (ZANAFLEX) 2 MG tablet Take 1-2 tablets (2-4 mg total) by mouth every 8 (eight) hours as needed for muscle spasms (Shoulder pain). Patient not taking: Reported on 10/09/2022 05/14/22   Bing Neighbors, NP      PHYSICAL EXAMINATION:   VITAL SIGNS: Last menstrual period 05/29/2022.  GENERAL:  27 y.o.-year-old patient no acute distress.  HEENT: Head atraumatic, normocephalic. Oropharynx and nasopharynx clear. MP 1, TM distance >3 cm, normal mouth opening. LUNGS: No use of accessory muscles of respiration.   EXTREMITIES: No pedal edema, cyanosis, or clubbing.  NEUROLOGIC: normal gait PSYCHIATRIC: The patient is alert and oriented x 3.  SKIN: No obvious rash, lesion, or ulcer.    IMPRESSION AND PLAN:   Rose Bradley  is a 27 y.o. female presenting with obesity during pregnancy. BMI  is currently 49 at [redacted] weeks gestation.   We discussed analgesic options during labor including epidural analgesia. Discussed that in obesity there can be increased difficulty with epidural placement or even failure of successful epidural. We also discussed that even after successful epidural placement there is increased risk of catheter migration out of the epidural space that would require catheter replacement. Discussed use of epidural vs  spinal vs GA if cesarean delivery is required. Discussed increased risk of difficult intubation during pregnancy should an emergency cesarean delivery be required.   This patient is appropriate for anesthetic care at [facility] in [place].  We discussed the limitations of a community hospital including but not limited to staffing, imaging, medications, and blood products.  The patient is aware of these limitations.  All questions answered and concerns addressed.   Dorcas Carrow, MD  Anesthesiology

## 2023-02-09 ENCOUNTER — Other Ambulatory Visit: Payer: Self-pay

## 2023-02-09 DIAGNOSIS — M419 Scoliosis, unspecified: Secondary | ICD-10-CM

## 2023-02-09 DIAGNOSIS — O99213 Obesity complicating pregnancy, third trimester: Secondary | ICD-10-CM

## 2023-02-11 ENCOUNTER — Other Ambulatory Visit: Payer: Self-pay

## 2023-02-11 ENCOUNTER — Ambulatory Visit: Payer: Medicaid Other | Attending: Maternal & Fetal Medicine

## 2023-02-11 DIAGNOSIS — E669 Obesity, unspecified: Secondary | ICD-10-CM

## 2023-02-11 DIAGNOSIS — O358XX0 Maternal care for other (suspected) fetal abnormality and damage, not applicable or unspecified: Secondary | ICD-10-CM | POA: Insufficient documentation

## 2023-02-11 DIAGNOSIS — M419 Scoliosis, unspecified: Secondary | ICD-10-CM

## 2023-02-11 DIAGNOSIS — O99213 Obesity complicating pregnancy, third trimester: Secondary | ICD-10-CM | POA: Diagnosis not present

## 2023-02-11 DIAGNOSIS — Z3A36 36 weeks gestation of pregnancy: Secondary | ICD-10-CM | POA: Insufficient documentation

## 2023-02-11 DIAGNOSIS — O99891 Other specified diseases and conditions complicating pregnancy: Secondary | ICD-10-CM | POA: Diagnosis not present

## 2023-02-17 ENCOUNTER — Other Ambulatory Visit: Payer: Self-pay

## 2023-02-17 DIAGNOSIS — O99213 Obesity complicating pregnancy, third trimester: Secondary | ICD-10-CM

## 2023-02-17 DIAGNOSIS — M419 Scoliosis, unspecified: Secondary | ICD-10-CM

## 2023-02-18 ENCOUNTER — Ambulatory Visit: Payer: Medicaid Other | Attending: Maternal & Fetal Medicine

## 2023-02-18 ENCOUNTER — Other Ambulatory Visit: Payer: Self-pay

## 2023-02-18 DIAGNOSIS — O99891 Other specified diseases and conditions complicating pregnancy: Secondary | ICD-10-CM

## 2023-02-18 DIAGNOSIS — Z362 Encounter for other antenatal screening follow-up: Secondary | ICD-10-CM | POA: Insufficient documentation

## 2023-02-18 DIAGNOSIS — O99213 Obesity complicating pregnancy, third trimester: Secondary | ICD-10-CM | POA: Insufficient documentation

## 2023-02-18 DIAGNOSIS — Z3A37 37 weeks gestation of pregnancy: Secondary | ICD-10-CM | POA: Insufficient documentation

## 2023-02-18 DIAGNOSIS — M419 Scoliosis, unspecified: Secondary | ICD-10-CM | POA: Insufficient documentation

## 2023-02-18 DIAGNOSIS — O358XX0 Maternal care for other (suspected) fetal abnormality and damage, not applicable or unspecified: Secondary | ICD-10-CM | POA: Diagnosis not present

## 2023-02-19 ENCOUNTER — Encounter: Payer: Self-pay | Admitting: Obstetrics and Gynecology

## 2023-02-19 ENCOUNTER — Observation Stay
Admission: EM | Admit: 2023-02-19 | Discharge: 2023-02-19 | Disposition: A | Discharge status: Home / Self Care | Payer: Medicaid Other | Attending: Certified Nurse Midwife | Admitting: Certified Nurse Midwife

## 2023-02-19 ENCOUNTER — Other Ambulatory Visit: Payer: Self-pay

## 2023-02-19 DIAGNOSIS — O163 Unspecified maternal hypertension, third trimester: Secondary | ICD-10-CM | POA: Diagnosis present

## 2023-02-19 DIAGNOSIS — Z3A38 38 weeks gestation of pregnancy: Secondary | ICD-10-CM | POA: Insufficient documentation

## 2023-02-19 DIAGNOSIS — O133 Gestational [pregnancy-induced] hypertension without significant proteinuria, third trimester: Secondary | ICD-10-CM | POA: Diagnosis present

## 2023-02-19 LAB — COMPREHENSIVE METABOLIC PANEL
ALT: 26 U/L (ref 0–44)
AST: 32 U/L (ref 15–41)
Albumin: 2.7 g/dL — ABNORMAL LOW (ref 3.5–5.0)
Alkaline Phosphatase: 137 U/L — ABNORMAL HIGH (ref 38–126)
Anion gap: 8 (ref 5–15)
BUN: 10 mg/dL (ref 6–20)
CO2: 22 mmol/L (ref 22–32)
Calcium: 8.9 mg/dL (ref 8.9–10.3)
Chloride: 105 mmol/L (ref 98–111)
Creatinine, Ser: 0.92 mg/dL (ref 0.44–1.00)
GFR, Estimated: >60 mL/min (ref >60)
Glucose, Bld: 84 mg/dL (ref 70–99)
Potassium: 4.4 mmol/L (ref 3.5–5.1)
Sodium: 135 mmol/L (ref 135–145)
Total Bilirubin: 0.6 mg/dL (ref 0.3–1.2)
Total Protein: 6.7 g/dL (ref 6.5–8.1)

## 2023-02-19 LAB — PROTEIN / CREATININE RATIO, URINE
Creatinine, Urine: 288 mg/dL
Protein Creatinine Ratio: 0.11 mg/mg{Cre} (ref 0.00–0.15)
Total Protein, Urine: 32 mg/dL

## 2023-02-19 LAB — CBC
HCT: 40.7 % (ref 36.0–46.0)
Hemoglobin: 13.7 g/dL (ref 12.0–15.0)
MCH: 28.1 pg (ref 26.0–34.0)
MCHC: 33.7 g/dL (ref 30.0–36.0)
MCV: 83.4 fL (ref 80.0–100.0)
Platelets: 349 10*3/uL (ref 150–400)
RBC: 4.88 MIL/uL (ref 3.87–5.11)
RDW: 12.8 % (ref 11.5–15.5)
WBC: 11.6 10*3/uL — ABNORMAL HIGH (ref 4.0–10.5)
nRBC: 0 % (ref 0.0–0.2)

## 2023-02-19 NOTE — OB Triage Note (Signed)
Patient is a 27 yo, G1P0, at 38 weeks 0 days. Patient presents to L&D from the office due to elevated blood pressures. Patient denies any vaginal bleeding or LOF. Patient denies headache, vision changes, epigastric pain, and any sudden swelling. Patient reports +FM. Monitors applied and assessing. Initial BP is 138/84. Initial fetal heart tone is 140. [place] CNM aware of patients arrival to unit. Plan to place in observation for pre PIH evaluation and assess blood pressures q15 minutes.

## 2023-02-19 NOTE — Discharge Summary (Signed)
Patient ID: Rose Bradley MRN: [id number] DOB/AGE: 05-13-1996 27 y.o.  Admit date: 02/19/2023 Discharge date: 02/19/2023  Admission Diagnoses: 27yo G1P0 at [redacted]w[redacted]d was sent from the office for elevated BPs. Initial BP in the office was 141/80, repeat 136/92.  Discharge Diagnoses: Elevated BPs  Factors complicating pregnancy: Anxiety Scoliosis BMI 40 or more - Obesity Class III  History of sacrum and coccyx fracture   Prenatal Procedures: NST  Consults: None  Significant Diagnostic Studies:  Results for orders placed or performed during the hospital encounter of 02/19/23 (from the past 168 hour(s))  Protein / creatinine ratio, urine   Collection Time: 02/19/23  5:03 PM  Result Value Ref Range   Creatinine, Urine 288 mg/dL   Total Protein, Urine 32 mg/dL   Protein Creatinine Ratio 0.11 0.00 - 0.15 mg/mg[Cre]  CBC   Collection Time: 02/19/23  5:38 PM  Result Value Ref Range   WBC 11.6 (H) 4.0 - 10.5 K/uL   RBC 4.88 3.87 - 5.11 MIL/uL   Hemoglobin 13.7 12.0 - 15.0 g/dL   HCT 60.6 30.1 - 60.1 %   MCV 83.4 80.0 - 100.0 fL   MCH 28.1 26.0 - 34.0 pg   MCHC 33.7 30.0 - 36.0 g/dL   RDW 09.3 23.5 - 57.3 %   Platelets 349 150 - 400 K/uL   nRBC 0.0 0.0 - 0.2 %  Comprehensive metabolic panel   Collection Time: 02/19/23  5:38 PM  Result Value Ref Range   Sodium 135 135 - 145 mmol/L   Potassium 4.4 3.5 - 5.1 mmol/L   Chloride 105 98 - 111 mmol/L   CO2 22 22 - 32 mmol/L   Glucose, Bld 84 70 - 99 mg/dL   BUN 10 6 - 20 mg/dL   Creatinine, Ser 2.20 0.44 - 1.00 mg/dL   Calcium 8.9 8.9 - 25.4 mg/dL   Total Protein 6.7 6.5 - 8.1 g/dL   Albumin 2.7 (L) 3.5 - 5.0 g/dL   AST 32 15 - 41 U/L   ALT 26 0 - 44 U/L   Alkaline Phosphatase 137 (H) 38 - 126 U/L   Total Bilirubin 0.6 0.3 - 1.2 mg/dL   GFR, Estimated >27 >06 mL/min   Anion gap 8 5 - 15   Vitals:   02/19/23 1628 02/19/23 1642 02/19/23 1657 02/19/23 1711  BP: 134/85 138/84 119/83 130/76   02/19/23 1727 02/19/23 1741 02/19/23 1756  02/19/23 1812  BP: 124/79 126/75 124/89 (!) 116/99   02/19/23 1827 02/19/23 1842 02/19/23 1857  BP: 135/83 132/87 130/73     Treatments: none  Hospital Course:  This is a 27 y.o. G1P0000 with IUP at [redacted]w[redacted]d seen for elevated BPs in the office.  Labs were collected and all WNL with a PCR of 110.  Pt denies HA, vision changes, or epigastric pain.  Only 1 elevated BP of 116/99 which occurred after the pt tried to put her cuff on herself.   No leaking of fluid and no bleeding.   She was observed, fetal heart rate monitoring remained reassuring, and she had no signs/symptoms of labor or other maternal-fetal concerns.  She was deemed stable for discharge to home with outpatient follow up.  Discharge Physical Exam:  BP 130/73   Pulse 96   LMP 05/29/2022   General: NAD CV: RRR Pulm: nl effort ABD: s/nd/nt, gravid DVT Evaluation: LE non-ttp, no evidence of DVT on exam.  NST: FHR baseline: 135 bpm Variability: moderate Accelerations: yes Decelerations: none Category/reactivity: reactive  TOCO:  quiet SVE: deferred      Discharge Condition: Stable  Disposition: Discharge disposition: 01-Home or Self Care        Allergies as of 02/19/2023       Reactions   Prednisone Nausea And Vomiting   Amoxicillin Nausea And Vomiting   Duloxetine Hcl    Other reaction(s): felt bad   Penicillin G    Other reaction(s): Unknown   Penicillins Other (See Comments)   unknown        Medication List     STOP taking these medications    celecoxib 200 MG capsule Commonly known as: CeleBREX   lidocaine 5 % Commonly known as: Lidoderm   metoprolol succinate 25 MG 24 hr tablet Commonly known as: TOPROL-XL   tiZANidine 2 MG tablet Commonly known as: ZANAFLEX       TAKE these medications    aspirin EC 81 MG tablet Take 81 mg by mouth daily. Swallow whole.   prenatal multivitamin Tabs tablet Take 1 tablet by mouth daily at 12 noon.        Follow-up Information      [facility] Follow up.   Why: Keep all scheduled appointments Contact information: [address] [place] [address] [phone]                Signed:  Quillian Quince 02/19/2023 7:39 PM

## 2023-02-23 ENCOUNTER — Other Ambulatory Visit: Payer: Self-pay

## 2023-02-23 DIAGNOSIS — O99213 Obesity complicating pregnancy, third trimester: Secondary | ICD-10-CM

## 2023-02-23 DIAGNOSIS — M419 Scoliosis, unspecified: Secondary | ICD-10-CM

## 2023-02-25 ENCOUNTER — Other Ambulatory Visit: Payer: Self-pay

## 2023-02-25 ENCOUNTER — Ambulatory Visit: Payer: Medicaid Other | Attending: Maternal & Fetal Medicine

## 2023-02-25 DIAGNOSIS — O99893 Other specified diseases and conditions complicating puerperium: Secondary | ICD-10-CM | POA: Diagnosis not present

## 2023-02-25 DIAGNOSIS — Z3A38 38 weeks gestation of pregnancy: Secondary | ICD-10-CM | POA: Diagnosis not present

## 2023-02-25 DIAGNOSIS — O99213 Obesity complicating pregnancy, third trimester: Secondary | ICD-10-CM

## 2023-02-25 DIAGNOSIS — O99891 Other specified diseases and conditions complicating pregnancy: Secondary | ICD-10-CM | POA: Diagnosis not present

## 2023-02-25 DIAGNOSIS — M419 Scoliosis, unspecified: Secondary | ICD-10-CM | POA: Insufficient documentation

## 2023-02-25 DIAGNOSIS — E669 Obesity, unspecified: Secondary | ICD-10-CM

## 2023-02-26 ENCOUNTER — Inpatient Hospital Stay
Admission: EM | Admit: 2023-02-26 | Discharge: 2023-03-01 | DRG: 768 | Disposition: A | Discharge status: Home / Self Care | Payer: Medicaid Other | Attending: Certified Nurse Midwife | Admitting: Certified Nurse Midwife

## 2023-02-26 ENCOUNTER — Encounter: Payer: Self-pay | Admitting: Obstetrics and Gynecology

## 2023-02-26 DIAGNOSIS — Z3A39 39 weeks gestation of pregnancy: Secondary | ICD-10-CM

## 2023-02-26 DIAGNOSIS — Z8249 Family history of ischemic heart disease and other diseases of the circulatory system: Secondary | ICD-10-CM

## 2023-02-26 DIAGNOSIS — Z7982 Long term (current) use of aspirin: Secondary | ICD-10-CM | POA: Diagnosis not present

## 2023-02-26 DIAGNOSIS — O134 Gestational [pregnancy-induced] hypertension without significant proteinuria, complicating childbirth: Principal | ICD-10-CM | POA: Diagnosis present

## 2023-02-26 DIAGNOSIS — O99214 Obesity complicating childbirth: Secondary | ICD-10-CM | POA: Diagnosis present

## 2023-02-26 DIAGNOSIS — Z23 Encounter for immunization: Secondary | ICD-10-CM | POA: Diagnosis not present

## 2023-02-26 DIAGNOSIS — Z3490 Encounter for supervision of normal pregnancy, unspecified, unspecified trimester: Principal | ICD-10-CM | POA: Diagnosis present

## 2023-02-26 LAB — COMPREHENSIVE METABOLIC PANEL
ALT: 23 U/L (ref 0–44)
AST: 29 U/L (ref 15–41)
Albumin: 2.8 g/dL — ABNORMAL LOW (ref 3.5–5.0)
Alkaline Phosphatase: 149 U/L — ABNORMAL HIGH (ref 38–126)
Anion gap: 11 (ref 5–15)
BUN: 13 mg/dL (ref 6–20)
CO2: 18 mmol/L — ABNORMAL LOW (ref 22–32)
Calcium: 8.6 mg/dL — ABNORMAL LOW (ref 8.9–10.3)
Chloride: 105 mmol/L (ref 98–111)
Creatinine, Ser: 0.87 mg/dL (ref 0.44–1.00)
GFR, Estimated: >60 mL/min (ref >60)
Glucose, Bld: 92 mg/dL (ref 70–99)
Potassium: 3.8 mmol/L (ref 3.5–5.1)
Sodium: 134 mmol/L — ABNORMAL LOW (ref 135–145)
Total Bilirubin: 0.6 mg/dL (ref 0.3–1.2)
Total Protein: 6.5 g/dL (ref 6.5–8.1)

## 2023-02-26 LAB — ABO/RH: ABO/RH(D): O POS

## 2023-02-26 LAB — CBC
HCT: 40.6 % (ref 36.0–46.0)
Hemoglobin: 13.7 g/dL (ref 12.0–15.0)
MCH: 28.1 pg (ref 26.0–34.0)
MCHC: 33.7 g/dL (ref 30.0–36.0)
MCV: 83.2 fL (ref 80.0–100.0)
Platelets: 359 10*3/uL (ref 150–400)
RBC: 4.88 MIL/uL (ref 3.87–5.11)
RDW: 13 % (ref 11.5–15.5)
WBC: 12.6 10*3/uL — ABNORMAL HIGH (ref 4.0–10.5)
nRBC: 0 % (ref 0.0–0.2)

## 2023-02-26 LAB — TYPE AND SCREEN
ABO/RH(D): O POS
Antibody Screen: NEGATIVE

## 2023-02-26 LAB — RPR: RPR Ser Ql: NONREACTIVE

## 2023-02-26 MED ORDER — ONDANSETRON HCL 4 MG/2ML IJ SOLN: 4.0000 mg | Freq: Four times a day (QID) | INTRAMUSCULAR | Status: DC | PRN

## 2023-02-26 MED ORDER — LIDOCAINE HCL (PF) 1 % IJ SOLN: 30.0000 mL | INTRAMUSCULAR | Status: DC | PRN

## 2023-02-26 MED ORDER — FENTANYL CITRATE (PF) 100 MCG/2ML IJ SOLN
50.0000 ug | INTRAMUSCULAR | Status: DC | PRN
  Administered 2023-02-27: 50 ug via INTRAVENOUS
  Filled 2023-02-26: qty 2

## 2023-02-26 MED ORDER — LACTATED RINGERS IV SOLN: 500.0000 mL | INTRAVENOUS | Status: DC | PRN

## 2023-02-26 MED ORDER — MISOPROSTOL 25 MCG QUARTER TABLET
25.0000 ug | ORAL_TABLET | ORAL | Status: DC
  Administered 2023-02-26 – 2023-02-27 (×5): 25 ug via ORAL
  Filled 2023-02-26 (×5): qty 1

## 2023-02-26 MED ORDER — OXYTOCIN 10 UNIT/ML IJ SOLN
INTRAMUSCULAR | Status: AC
  Filled 2023-02-26: qty 2

## 2023-02-26 MED ORDER — AMMONIA AROMATIC IN INHA
RESPIRATORY_TRACT | Status: AC
  Filled 2023-02-26: qty 10

## 2023-02-26 MED ORDER — SOD CITRATE-CITRIC ACID 500-334 MG/5ML PO SOLN: 30.0000 mL | ORAL | Status: DC | PRN

## 2023-02-26 MED ORDER — MISOPROSTOL 25 MCG QUARTER TABLET
25.0000 ug | ORAL_TABLET | ORAL | Status: DC
  Administered 2023-02-26 – 2023-02-27 (×5): 25 ug via VAGINAL
  Filled 2023-02-26 (×5): qty 1

## 2023-02-26 MED ORDER — OXYTOCIN BOLUS FROM INFUSION: 333.0000 mL | Freq: Once | INTRAVENOUS | Status: DC

## 2023-02-26 MED ORDER — TERBUTALINE SULFATE 1 MG/ML IJ SOLN: 0.2500 mg | Freq: Once | INTRAMUSCULAR | Status: DC | PRN

## 2023-02-26 MED ORDER — ACETAMINOPHEN 325 MG PO TABS
650.0000 mg | ORAL_TABLET | ORAL | Status: DC | PRN
  Administered 2023-02-28: 650 mg via ORAL
  Filled 2023-02-26: qty 2

## 2023-02-26 MED ORDER — LIDOCAINE HCL (PF) 1 % IJ SOLN
INTRAMUSCULAR | Status: AC
  Filled 2023-02-26: qty 30

## 2023-02-26 MED ORDER — LACTATED RINGERS IV SOLN: INTRAVENOUS | Status: DC

## 2023-02-26 MED ORDER — MISOPROSTOL 200 MCG PO TABS
ORAL_TABLET | ORAL | Status: AC
  Filled 2023-02-26: qty 4

## 2023-02-26 MED ORDER — OXYTOCIN-SODIUM CHLORIDE 30-0.9 UT/500ML-% IV SOLN
2.5000 [IU]/h | INTRAVENOUS | Status: DC
  Administered 2023-02-28: 2.5 [IU]/h via INTRAVENOUS
  Filled 2023-02-26 (×2): qty 500

## 2023-02-26 MED ORDER — OXYTOCIN-SODIUM CHLORIDE 30-0.9 UT/500ML-% IV SOLN
1.0000 m[IU]/min | INTRAVENOUS | Status: DC
  Administered 2023-02-26: 2 m[IU]/min via INTRAVENOUS

## 2023-02-26 NOTE — Progress Notes (Signed)
Labor Progress Note  Rose Bradley is a 27 y.o. G1P0000 at [redacted]w[redacted]d by LMP admitted for induction of labor due to gestational hypertension.  Subjective: Updated by RN, pt is still not feeling the contractions intensely and managing very well.  Objective: BP (!) 123/91 (BP Location: Left Arm)   Pulse 84   Temp 99.1 F (37.3 C) (Oral)   Resp 18   Ht 5\' 7"  (1.702 m)   Wt (!) 142.9 kg   LMP 05/29/2022   BMI 49.34 kg/m   Fetal Assessment: FHT:  FHR: 135 bpm, variability: moderate,  accelerations:  Present,  decelerations:  Present possible variables however strip is not constant so it is difficult to know Category/reactivity:  Category I UC:   q 1-6 mins  with tripling  SVE:    Dilation: 1cm  Effacement: 60%  Station:  -3  Consistency: soft  Position: anterior  Membrane status: Intact Amniotic color: n/a  Labs: Lab Results  Component Value Date   WBC 12.6 (H) 02/26/2023   HGB 13.7 02/26/2023   HCT 40.6 02/26/2023   MCV 83.2 02/26/2023   PLT 359 02/26/2023    Assessment / Plan: Induction of labor due to gestational hypertension 0639 Cytotec PO and PV 1039 Cytotec PO and PV 1440 Cytotec PO and PV 1900 Pitocin started Currently at 8 mU  Labor: Progressing normally Preeclampsia:   123/91, labs stable Fetal Wellbeing:  Category I Pain Control:  Labor support without medications I/D:   Afebrile, GBS neg, Intact Anticipated MOD:  NSVD  Cyril Mourning, CNM 02/26/2023, 9:55 PM

## 2023-02-26 NOTE — Progress Notes (Signed)
Labor Progress Note  Rose Bradley is a 27 y.o. G1P0000 at [redacted]w[redacted]d by LMP admitted for induction of labor due to gestational hypertension.  Subjective: Pt is comfortable, reports some light cramping  Objective: BP 117/64 (BP Location: Right Arm)   Pulse 80   Temp 98.4 F (36.9 C) (Oral)   Resp 16   Ht 5\' 7"  (1.702 m)   Wt (!) 142.9 kg   LMP 05/29/2022   BMI 49.34 kg/m   Fetal Assessment: FHT:  FHR: 135 bpm, variability: moderate,  accelerations:  Present,  decelerations:  Absent Category/reactivity:  Category I UC:   q 2 mins SVE:    Dilation: Closed  Effacement: 60%  Station:  -3  Consistency: soft  Position: posterior  Membrane status: Intact Amniotic color: n/a  Labs: Lab Results  Component Value Date   WBC 12.6 (H) 02/26/2023   HGB 13.7 02/26/2023   HCT 40.6 02/26/2023   MCV 83.2 02/26/2023   PLT 359 02/26/2023    Assessment / Plan: Induction of labor due to gestational hypertension 0639 Cytotec PO and PV Will continue with cytotec  Labor: Progressing normally Preeclampsia:   117/64, labs stable Fetal Wellbeing:  Category I Pain Control:  Labor support without medications I/D:   Afebrile, GBS neg, Intact Anticipated MOD:  NSVD  Cyril Mourning, CNM 02/26/2023, 2:45 PM

## 2023-02-26 NOTE — Progress Notes (Signed)
Labor Progress Note  Rose Bradley is a 27 y.o. G1P0000 at [redacted]w[redacted]d by LMP admitted for induction of labor due to gestational hypertension.  Subjective: Pt is comfortable, reports some light cramping  Objective: BP 139/89 (BP Location: Left Arm)   Pulse 84   Temp 98.8 F (37.1 C) (Oral)   Resp 18   Ht 5\' 7"  (1.702 m)   Wt (!) 142.9 kg   LMP 05/29/2022   BMI 49.34 kg/m   Fetal Assessment: FHT:  FHR: 135 bpm, variability: moderate,  accelerations:  Present,  decelerations:  Absent Category/reactivity:  Category I UC:   q 2 mins SVE:    Dilation: Closed  Effacement: 60%  Station:  -3  Consistency: soft  Position: posterior  Membrane status: Intact Amniotic color: n/a  Labs: Lab Results  Component Value Date   WBC 12.6 (H) 02/26/2023   HGB 13.7 02/26/2023   HCT 40.6 02/26/2023   MCV 83.2 02/26/2023   PLT 359 02/26/2023    Assessment / Plan: Induction of labor due to gestational hypertension 0639 Cytotec PO and PV Will continue with cytotec  Labor: Progressing normally Preeclampsia:   139/89 and 137/67, PCR on 02/19/23 110 Fetal Wellbeing:  Category I Pain Control:  Labor support without medications I/D:   Afebrile, GBS neg, Intact Anticipated MOD:  NSVD  Jenifer E Darlyn Repsher, CNM 02/26/2023, 10:11 AM

## 2023-02-26 NOTE — H&P (Signed)
OB History & Physical   History of Present Illness:  Chief Complaint:   HPI:  Rose Bradley is a 27 y.o. G1P0000 female at 101w0d dated by LMP.  She presents to L&D for IOL for GHTN.  She reports:  -active fetal movement -no leakage of fluid  -no vaginal bleeding -no contractions  Pregnancy Issues: GHTN  Scoliosis BMI 40 or more - BMI at NOB 44 History of sacrum and coccyx fracture    Maternal Medical History:   Past Medical History:  Diagnosis Date   Murmur     Past Surgical History:  Procedure Laterality Date   NO PAST SURGERIES     TURBINATE REDUCTION Bilateral 02/23/2018   Procedure: BILATERAL TURBINATE REDUCTION;  Surgeon: Newman Pies, MD;  Location: [facility];  Service: ENT;  Laterality: Bilateral;    Allergies  Allergen Reactions   Prednisone Nausea And Vomiting   Amoxicillin Nausea And Vomiting   Duloxetine Hcl     Other reaction(s): felt bad   Penicillin G     Other reaction(s): Unknown   Penicillins Other (See Comments)    unknown    Prior to Admission medications   Medication Sig Start Date End Date Taking? Authorizing Provider  aspirin EC 81 MG tablet Take 81 mg by mouth daily. Swallow whole.   Yes [provider]  Prenatal Vit-Fe Fumarate-FA (PRENATAL MULTIVITAMIN) TABS tablet Take 1 tablet by mouth daily at 12 noon.   Yes [provider]     Prenatal care site: [facility]   Social History: She  reports that she has never smoked. She has never used smokeless tobacco. She reports that she does not drink alcohol and does not use drugs.  Family History: family history includes Heart disease in her maternal grandfather; Hypertension in her father.   Review of Systems: A full review of systems was performed and negative except as noted in the HPI.    Physical Exam:  Vital Signs: BP 139/89 (BP Location: Left Arm)   Pulse 84   Temp 98.8 F (37.1 C) (Oral)   Resp 18   Ht 5\' 7"  (1.702 m)   Wt (!) 142.9 kg    LMP 05/29/2022   BMI 49.34 kg/m   General:   alert and cooperative  Skin:  normal  Neurologic:    Alert & oriented x 3  Lungs:    Nl effort  Heart:   regular rate and rhythm  Abdomen:  soft, non-tender; bowel sounds normal; no masses,  no organomegaly  Extremities: : non-tender, symmetric, no edema bilaterally.      EFW: 02/05/23: 7lb0oz(3163g)= 84%   Results for orders placed or performed during the hospital encounter of 02/26/23 (from the past 24 hour(s))  CBC     Status: Abnormal   Collection Time: 02/26/23  6:03 AM  Result Value Ref Range   WBC 12.6 (H) 4.0 - 10.5 K/uL   RBC 4.88 3.87 - 5.11 MIL/uL   Hemoglobin 13.7 12.0 - 15.0 g/dL   HCT 09.8 11.9 - 14.7 %   MCV 83.2 80.0 - 100.0 fL   MCH 28.1 26.0 - 34.0 pg   MCHC 33.7 30.0 - 36.0 g/dL   RDW 82.9 56.2 - 13.0 %   Platelets 359 150 - 400 K/uL   nRBC 0.0 0.0 - 0.2 %  Type and screen [facility]     Status: None   Collection Time: 02/26/23  6:03 AM  Result Value Ref Range  ABO/RH(D) O POS    Antibody Screen NEG    Sample Expiration      03/01/2023,2359 Performed at [facility], [address] [place], [place] [address]   ABO/Rh     Status: None   Collection Time: 02/26/23  8:34 AM  Result Value Ref Range   ABO/RH(D)      O POS Performed at [facility], [address] [place], [place] [address]     Pertinent Results:  Prenatal Labs: Blood type/Rh O pos  Antibody screen neg  Rubella Immune  Varicella Non-Immune  RPR NR  HBsAg Neg  HIV NR  GC neg  Chlamydia neg  Genetic screening negative  1 hour GTT 77  3 hour GTT   GBS neg   FHT: FHR: 145 bpm, variability: moderate,  accelerations:  Present,  decelerations:  Absent Category/reactivity:  Category I TOCO: UI SVE: Dilation: Closed / Effacement (%): 60 / Station: -3    [place] MFM FETAL BPP WO NON STRESS  Result Date: 02/25/2023 ----------------------------------------------------------------------  OBSTETRICS  REPORT                       (Signed Final 02/25/2023 04:39 pm) ---------------------------------------------------------------------- Patient Info  ID #:       [id number]                          D.O.B.:  09/25/1996 (27 yrs)  Name:       Rose Bradley                   Visit Date: 02/25/2023 03:19 pm ---------------------------------------------------------------------- Performed By  Attending:        Lin Landsman      Ref. Address:     [facility]                    MD  Performed By:     Neita Carp RDMS        [facility]         [facility]                                                             [facility] at                                                             [facility]  Referred By:      Mila Homer              Visit Type:       MFM                    Jean Rosenthal MD ---------------------------------------------------------------------- Orders  #  Description                           Code        Ordered By  1  [place] MFM FETAL BPP WO NON  13086.57    [facility]     STRESS                                            BOOKER ----------------------------------------------------------------------  #  Order #                     Accession #                Episode #  1  [id number]                   [id number]                 [id number] ---------------------------------------------------------------------- Indications  [redacted] weeks gestation of pregnancy                Z3A.38  Obesity complicating pregnancy, third          O99.213  trimester  maternal indication (scoliosis)                O35.8XX0 ---------------------------------------------------------------------- Fetal Evaluation  Num Of Fetuses:         1  Fetal Heart Rate(bpm):  149  Cardiac Activity:       Observed  Presentation:           Cephalic  Placenta:               Anterior  P. Cord Insertion:      Previously visualized  AFI Sum(cm)     %Tile       Largest Pocket(cm)  11.06           36          3.52  RUQ(cm)       RLQ(cm)        LUQ(cm)        LLQ(cm)  3.52          2.84          2.45           2.25 ---------------------------------------------------------------------- Biophysical Evaluation  Amniotic F.V:   Within normal limits       F. Tone:        Observed  F. Movement:    Observed                   Score:          8/8  F. Breathing:   Observed ---------------------------------------------------------------------- OB History  Gravidity:    1  Living:       0 ---------------------------------------------------------------------- Gestational Age  LMP:           38w 6d        Date:  05/29/22                  EDD:   03/05/23  Best:          38w 6d     Det. By:  LMP  (05/29/22)          EDD:   03/05/23 ---------------------------------------------------------------------- Impression  Antenatal testing performed given elevated BMI  The biophysical profile was 8/8 with good fetal movement and  amniotic fluid volume. ---------------------------------------------------------------------- Recommendations  Delivery scheduled. ----------------------------------------------------------------------              Lin Landsman, MD Electronically Signed Final Report   02/25/2023 04:39 pm ----------------------------------------------------------------------  [place] MFM  FETAL BPP WO NON STRESS  Result Date: 02/18/2023 ----------------------------------------------------------------------  OBSTETRICS REPORT                       (Signed Final 02/18/2023 03:58 pm) ---------------------------------------------------------------------- Patient Info  ID #:       [id number]                          D.O.B.:  January 11, 1996 (27 yrs)  Name:       Rose Bradley                   Visit Date: 02/18/2023 03:52 pm ---------------------------------------------------------------------- Performed By  Attending:        Lin Landsman      Ref. Address:     [facility]                    MD  Performed By:     Eden Lathe BS      Location[facility]         [facility]                     RDMS RVT                                 Fetal Care at                                                             [facility]  Referred By:      Conard Novak MD ---------------------------------------------------------------------- Orders  #  Description                           Code        Ordered By  1  [place] MFM FETAL BPP WO NON               82956.21    [facility]     STRESS                                            BOOKER ----------------------------------------------------------------------  #  Order #                     Accession #                Episode #  1  [id number]                   [id number]                 [id number] ---------------------------------------------------------------------- Indications  Obesity complicating pregnancy, third          O99.213  trimester (pregravid BMI 45)  Maternal indication (scoliosis)                O35.24XX0  Encounter for other antenatal screening        Z36.2  follow-up  [redacted] weeks gestation of pregnancy                Z3A.37 ---------------------------------------------------------------------- Fetal Evaluation  Num Of Fetuses:         1  Fetal Heart Rate(bpm):  145  Cardiac Activity:       Observed  Presentation:           Cephalic  Placenta:               Anterior  P. Cord Insertion:      Previously visualized  Amniotic Fluid  AFI FV:      Within normal limits  AFI Sum(cm)     %Tile       Largest Pocket(cm)  16.81           65          5.78  RUQ(cm)       RLQ(cm)       LUQ(cm)        LLQ(cm)  4.86          2.41          3.76           5.78 ---------------------------------------------------------------------- Biophysical Evaluation  Amniotic F.V:   Within normal limits       F. Tone:        Observed  F. Movement:    Observed                   Score:          8/8  F. Breathing:   Observed ---------------------------------------------------------------------- Biometry  LV:        3.1  mm  ---------------------------------------------------------------------- OB History  Gravidity:    1  Living:       0 ---------------------------------------------------------------------- Gestational Age  LMP:           37w 6d        Date:  05/29/22                  EDD:   03/05/23  Best:          37w 6d     Det. By:  LMP  (05/29/22)          EDD:   03/05/23 ---------------------------------------------------------------------- Anatomy  Stomach:               Appears normal, left   Bladder:                Appears normal                         sided  Kidneys:               Appear normal ---------------------------------------------------------------------- Cervix Uterus Adnexa  Cervix  Not visualized (advanced GA >24wks) ---------------------------------------------------------------------- Impression  Antenatal testing performed given maternal elevated BMI  The biophysical profile was 8/8 with good fetal movement and  amniotic fluid volume. ---------------------------------------------------------------------- Recommendations  Continue weekly testing. ----------------------------------------------------------------------              Lin Landsman, MD Electronically Signed Final Report   02/18/2023 03:58 pm ----------------------------------------------------------------------  [place] MFM OB FOLLOW UP  Result Date: 02/11/2023 ----------------------------------------------------------------------  OBSTETRICS REPORT                       (Signed Final 02/11/2023 04:46 pm) ---------------------------------------------------------------------- Patient Info  ID #:       [id number]  D.O.B.:  1996/08/25 (27 yrs)  Name:       Rose Bradley                   Visit Date: 02/11/2023 04:38 pm ---------------------------------------------------------------------- Performed By  Attending:        Lin Landsman      Ref. Address:     [facility]                    MD  Performed By:     Eden Lathe BS       Location[facility]         [facility]                    RDMS RVT                                 Fetal Care at                                                             [facility]  Referred By:      Conard Novak MD ---------------------------------------------------------------------- Orders  #  Description                           Code        Ordered By  1  [place] MFM OB FOLLOW UP                   76816.01    BURK SCHAIBLE  2  [place] MFM FETAL BPP WO NON               76819.01    [facility]     STRESS ----------------------------------------------------------------------  #  Order #                     Accession #                Episode #  1  [id number]                   [id number]                 [id number]  2  [id number]                   [id number]                 [id number] ---------------------------------------------------------------------- Indications  Obesity complicating pregnancy, third          O99.213  trimester (pregravid BMI 45)  Maternal indication (scoliosis)                O35.8XX0  Encounter for other antenatal screening        Z36.2  follow-up  [redacted] weeks gestation of pregnancy                Z3A.36 ---------------------------------------------------------------------- Fetal Evaluation  Num Of Fetuses:         1  Fetal Heart Rate(bpm):  164  Cardiac Activity:  Observed  Presentation:           Cephalic  Placenta:               Anterior  P. Cord Insertion:      Previously visualized  Amniotic Fluid  AFI FV:      Within normal limits  AFI Sum(cm)     %Tile       Largest Pocket(cm)  17.43           66          5.73  RUQ(cm)       RLQ(cm)       LUQ(cm)        LLQ(cm)  5.65          3.08          5.73           2.97 ---------------------------------------------------------------------- Biophysical Evaluation  Amniotic F.V:   Within normal limits       F. Tone:        Observed  F. Movement:    Observed                   Score:          8/8  F. Breathing:   Observed  ---------------------------------------------------------------------- Biometry  BPD:     97.55  mm     G. Age:  40w 0d       > 99  %    CI:        81.61   %    70 - 86                                                          FL/HC:      19.5   %    20.8 - 22.6  HC:    340.83   mm     G. Age:  39w 2d         79  %    HC/AC:      1.02        0.92 - 1.05  AC:    334.01   mm     G. Age:  37w 2d         75  %    FL/BPD:     68.2   %    71 - 87  FL:      66.57  mm     G. Age:  34w 2d        3.3  %    FL/AC:      19.9   %    20 - 24  LV:        3.5  mm  Est. FW:    3110  gm    6 lb 14 oz      62  % ---------------------------------------------------------------------- OB History  Gravidity:    1  Living:       0 ---------------------------------------------------------------------- Gestational Age  LMP:           36w 6d        Date:  05/29/22                  EDD:   03/05/23  U/S Today:     37w  5d                                        EDD:   02/27/23  Best:          36w 6d     Det. By:  LMP  (05/29/22)          EDD:   03/05/23 ---------------------------------------------------------------------- Anatomy  Cranium:               Appears normal         Aortic Arch:            Previously seen  Cavum:                 Appears normal         Ductal Arch:            Previously seen  Ventricles:            Appears normal         Diaphragm:              Previously seen  Choroid Plexus:        Previously seen        Stomach:                Appears normal, left                                                                        sided  Cerebellum:            Previously seen        Abdomen:                Appears normal  Posterior Fossa:       Previously seen        Abdominal Wall:         Previously seen  Nuchal Fold:           Previously seen        Cord Vessels:           Previously seen  Face:                  Profile nl; orbits     Kidneys:                Appear normal                         prev visualized  Lips:                   Previously seen        Bladder:                Appears normal  Thoracic:              Appears normal         Spine:                  Previously seen  Heart:  Previously seen; EIF   Upper Extremities:      Previously seen  RVOT:                  Previously seen        Lower Extremities:      Previously seen  LVOT:                  Previously seen  Other:  Female gender previously seen. Heels/feet and open hands/5th digits          prev visualized. Lenses, maxilla, mandible, falx, 3VV and 3VTV          previously visualized. Technically difficult due to maternal habitus. ---------------------------------------------------------------------- Cervix Uterus Adnexa  Cervix  Not visualized (advanced GA >24wks)  Uterus  No abnormality visualized.  Right Ovary  Not visualized.  Left Ovary  Not visualized.  Cul De Sac  No free fluid seen.  Adnexa  No abnormality visualized ---------------------------------------------------------------------- Impression  Follow up growth due to elevated BMI  Normal interval growth with measurements consistent with  dates  Good fetal movement and amniotic fluid volume  Biophysical profile 8/8 ---------------------------------------------------------------------- Recommendations  Continue weekly testing as previoiusy scheduled. ----------------------------------------------------------------------              Lin Landsman, MD Electronically Signed Final Report   02/11/2023 04:46 pm ----------------------------------------------------------------------  [place] MFM FETAL BPP WO NON STRESS  Result Date: 02/11/2023 ----------------------------------------------------------------------  OBSTETRICS REPORT                       (Signed Final 02/11/2023 04:46 pm) ---------------------------------------------------------------------- Patient Info  ID #:       [id number]                          D.O.B.:  1996/04/18 (27 yrs)  Name:       Rose Bradley                   Visit Date:  02/11/2023 04:38 pm ---------------------------------------------------------------------- Performed By  Attending:        Lin Landsman      Ref. Address:     [facility]                    MD  Performed By:     Eden Lathe BS      [facility]         [facility]                    RDMS RVT                                 Fetal Care at                                                             [facility]  Referred By:      Conard Novak MD ---------------------------------------------------------------------- Orders  #  Description  Code        Ordered By  1  [place] MFM OB FOLLOW UP                   [id number]    BURK SCHAIBLE  2  [place] MFM FETAL BPP WO NON               76819.01    [facility]     STRESS ----------------------------------------------------------------------  #  Order #                     Accession #                Episode #  1  [id number]                   [id number]                 [id number]  2  [id number]                   [id number]                 [id number] ---------------------------------------------------------------------- Indications  Obesity complicating pregnancy, third          O99.213  trimester (pregravid BMI 45)  Maternal indication (scoliosis)                O35.8XX0  Encounter for other antenatal screening        Z36.2  follow-up  [redacted] weeks gestation of pregnancy                Z3A.36 ---------------------------------------------------------------------- Fetal Evaluation  Num Of Fetuses:         1  Fetal Heart Rate(bpm):  164  Cardiac Activity:       Observed  Presentation:           Cephalic  Placenta:               Anterior  P. Cord Insertion:      Previously visualized  Amniotic Fluid  AFI FV:      Within normal limits  AFI Sum(cm)     %Tile       Largest Pocket(cm)  17.43           66          5.73  RUQ(cm)       RLQ(cm)       LUQ(cm)        LLQ(cm)  5.65          3.08          5.73           2.97  ---------------------------------------------------------------------- Biophysical Evaluation  Amniotic F.V:   Within normal limits       F. Tone:        Observed  F. Movement:    Observed                   Score:          8/8  F. Breathing:   Observed ---------------------------------------------------------------------- Biometry  BPD:     97.55  mm     G. Age:  40w 0d       > 99  %    CI:        81.61   %    70 - 86  FL/HC:      19.5   %    20.8 - 22.6  HC:    340.83   mm     G. Age:  39w 2d         79  %    HC/AC:      1.02        0.92 - 1.05  AC:    334.01   mm     G. Age:  37w 2d         75  %    FL/BPD:     68.2   %    71 - 87  FL:      66.57  mm     G. Age:  34w 2d        3.3  %    FL/AC:      19.9   %    20 - 24  LV:        3.5  mm  Est. FW:    3110  gm    6 lb 14 oz      62  % ---------------------------------------------------------------------- OB History  Gravidity:    1  Living:       0 ---------------------------------------------------------------------- Gestational Age  LMP:           36w 6d        Date:  05/29/22                  EDD:   03/05/23  U/S Today:     37w 5d                                        EDD:   02/27/23  Best:          36w 6d     Det. By:  LMP  (05/29/22)          EDD:   03/05/23 ---------------------------------------------------------------------- Anatomy  Cranium:               Appears normal         Aortic Arch:            Previously seen  Cavum:                 Appears normal         Ductal Arch:            Previously seen  Ventricles:            Appears normal         Diaphragm:              Previously seen  Choroid Plexus:        Previously seen        Stomach:                Appears normal, left                                                                        sided  Cerebellum:            Previously seen  Abdomen:                Appears normal  Posterior Fossa:       Previously seen        Abdominal Wall:          Previously seen  Nuchal Fold:           Previously seen        Cord Vessels:           Previously seen  Face:                  Profile nl; orbits     Kidneys:                Appear normal                         prev visualized  Lips:                  Previously seen        Bladder:                Appears normal  Thoracic:              Appears normal         Spine:                  Previously seen  Heart:                 Previously seen; EIF   Upper Extremities:      Previously seen  RVOT:                  Previously seen        Lower Extremities:      Previously seen  LVOT:                  Previously seen  Other:  Female gender previously seen. Heels/feet and open hands/5th digits          prev visualized. Lenses, maxilla, mandible, falx, 3VV and 3VTV          previously visualized. Technically difficult due to maternal habitus. ---------------------------------------------------------------------- Cervix Uterus Adnexa  Cervix  Not visualized (advanced GA >24wks)  Uterus  No abnormality visualized.  Right Ovary  Not visualized.  Left Ovary  Not visualized.  Cul De Sac  No free fluid seen.  Adnexa  No abnormality visualized ---------------------------------------------------------------------- Impression  Follow up growth due to elevated BMI  Normal interval growth with measurements consistent with  dates  Good fetal movement and amniotic fluid volume  Biophysical profile 8/8 ---------------------------------------------------------------------- Recommendations  Continue weekly testing as previoiusy scheduled. ----------------------------------------------------------------------              Lin Landsman, MD Electronically Signed Final Report   02/11/2023 04:46 pm ----------------------------------------------------------------------  [place] MFM FETAL BPP WO NON STRESS  Result Date: 01/28/2023 ----------------------------------------------------------------------  OBSTETRICS REPORT                        (Signed Final 01/28/2023 02:28 pm) ---------------------------------------------------------------------- Patient Info  ID #:       [id number]                          D.O.B.:  1996-07-17 (27 yrs)  Name:       Rose Bradley  Visit Date: 01/28/2023 02:20 pm ---------------------------------------------------------------------- Performed By  Attending:        Lin Landsman      Ref. Address:     [facility]                    MD  Performed By:     Neita Carp RDMS        Location[facility]         [facility]                                                             [facility] at                                                             [facility]  Referred By:      Conard Novak MD ---------------------------------------------------------------------- Orders  #  Description                           Code        Ordered By  1  [place] [facility] FETAL BPP WO NON               16109.60    CORENTHIAN     STRESS                                            BOOKER ----------------------------------------------------------------------  #  Order #                     Accession #                Episode #  1  [id number]                   [id number]                 [id number] ---------------------------------------------------------------------- Indications  [redacted] weeks gestation of pregnancy                Z3A.34  Obesity complicating pregnancy, third          O99.213  trimester ---------------------------------------------------------------------- Fetal Evaluation  Num Of Fetuses:         1  Fetal Heart Rate(bpm):  129  Cardiac Activity:       Observed  Presentation:           Cephalic  Placenta:               Anterior  P. Cord Insertion:      Previously visualized  AFI Sum(cm)     %Tile       Largest Pocket(cm)  16.51           60          6.24  RUQ(cm)  RLQ(cm)       LUQ(cm)        LLQ(cm)  3.29          2.13          6.24           4.85  ---------------------------------------------------------------------- Biophysical Evaluation  Amniotic F.V:   Within normal limits       F. Tone:        Observed  F. Movement:    Observed                   Score:          8/8  F. Breathing:   Observed ---------------------------------------------------------------------- OB History  Gravidity:    1  Living:       0 ---------------------------------------------------------------------- Gestational Age  LMP:           34w 6d        Date:  05/29/22                  EDD:   03/05/23  Best:          34w 6d     Det. By:  LMP  (05/29/22)          EDD:   03/05/23 ---------------------------------------------------------------------- Impression  Antenatal testing performed given elevated BMI  The biophysical profile was 8/8 with good fetal movement and  amniotic fluid volume. ---------------------------------------------------------------------- Recommendations  Continue weekly testing given elevated BMI. ----------------------------------------------------------------------              Lin Landsman, MD Electronically Signed Final Report   01/28/2023 02:28 pm ----------------------------------------------------------------------    Assessment:  Rose Bradley is a 27 y.o. G1P0000 female at [redacted]w[redacted]d with GHTN.   Plan:  1. Admit to Labor & Delivery; consents reviewed and obtained  2. Fetal Well being  - Fetal Tracing: Cat I - GBS  neg - Presentation: vtx confirmed by sve   3. Routine OB: - Prenatal labs reviewed, as above - Rh Pos - CBC & T&S on admit - Clear fluids, IVF  4. Induction of Labor -  Contractions by external toco in place -  Plan for induction with Cytotec -  Plan for continuous fetal monitoring  -  Maternal pain control as desired: IVPM, nitrous, regional anesthesia - Anticipate vaginal delivery  5. Post Partum Planning: - Infant feeding: Breastfeeding - Contraception: TBD - Tdap: Given 01/22/23   Haroldine Laws, CNM 02/26/2023 10:19  AM

## 2023-02-26 NOTE — Progress Notes (Signed)
Labor Progress Note  Rose Bradley is a 27 y.o. G1P0000 at [redacted]w[redacted]d by LMP admitted for induction of labor due to gestational hypertension.  Subjective: Pt is comfortable, reports some light cramping  Objective: BP 128/78 (BP Location: Right Arm)   Pulse 81   Temp 97.9 F (36.6 C) (Oral)   Resp 18   Ht 5\' 7"  (1.702 m)   Wt (!) 142.9 kg   LMP 05/29/2022   BMI 49.34 kg/m   Fetal Assessment: FHT:  FHR: 145 bpm, variability: moderate,  accelerations:  Present,  decelerations:  Absent Category/reactivity:  Category I UC:   q 1-2 mins SVE:    Dilation: 1cm  Effacement: 60%  Station:  -3  Consistency: soft  Position: anterior  Membrane status: Intact Amniotic color: n/a  Labs: Lab Results  Component Value Date   WBC 12.6 (H) 02/26/2023   HGB 13.7 02/26/2023   HCT 40.6 02/26/2023   MCV 83.2 02/26/2023   PLT 359 02/26/2023    Assessment / Plan: Induction of labor due to gestational hypertension 0639 Cytotec PO and PV 1039 Cytotec PO and PV 1440 Cytotec PO and PV Will now start pitocin   Labor: Progressing normally Preeclampsia:   128/78, labs stable Fetal Wellbeing:  Category I Pain Control:  Labor support without medications I/D:   Afebrile, GBS neg, Intact Anticipated MOD:  NSVD  Cyril Mourning, CNM 02/26/2023, 6:39 PM

## 2023-02-27 ENCOUNTER — Encounter: Payer: Self-pay | Admitting: Obstetrics and Gynecology

## 2023-02-27 ENCOUNTER — Inpatient Hospital Stay: Payer: Medicaid Other | Admitting: Anesthesiology

## 2023-02-27 MED ORDER — OXYTOCIN-SODIUM CHLORIDE 30-0.9 UT/500ML-% IV SOLN
1.0000 m[IU]/min | INTRAVENOUS | Status: DC
  Administered 2023-02-28: 6 m[IU]/min via INTRAVENOUS

## 2023-02-27 MED ORDER — EPHEDRINE 5 MG/ML INJ: 10.0000 mg | INTRAVENOUS | Status: DC | PRN

## 2023-02-27 MED ORDER — CALCIUM CARBONATE ANTACID 500 MG PO CHEW
800.0000 mg | CHEWABLE_TABLET | Freq: Once | ORAL | Status: AC
  Administered 2023-02-27: 800 mg via ORAL
  Filled 2023-02-27: qty 4

## 2023-02-27 MED ORDER — LACTATED RINGERS IV SOLN: 500.0000 mL | Freq: Once | INTRAVENOUS | Status: DC

## 2023-02-27 MED ORDER — PHENYLEPHRINE 80 MCG/ML (10ML) SYRINGE FOR IV PUSH (FOR BLOOD PRESSURE SUPPORT): 80.0000 ug | PREFILLED_SYRINGE | INTRAVENOUS | Status: DC | PRN

## 2023-02-27 MED ORDER — LIDOCAINE-EPINEPHRINE (PF) 1.5 %-1:200000 IJ SOLN
INTRAMUSCULAR | Status: DC | PRN
  Administered 2023-02-27: 3 mL via EPIDURAL

## 2023-02-27 MED ORDER — DIPHENHYDRAMINE HCL 50 MG/ML IJ SOLN: 12.5000 mg | INTRAMUSCULAR | Status: DC | PRN

## 2023-02-27 MED ORDER — FENTANYL-BUPIVACAINE-NACL 0.5-0.125-0.9 MG/250ML-% EP SOLN
12.0000 mL/h | EPIDURAL | Status: DC | PRN
  Administered 2023-02-27: 12 mL/h via EPIDURAL
  Filled 2023-02-27: qty 250

## 2023-02-27 MED ORDER — LIDOCAINE HCL (PF) 1 % IJ SOLN
INTRAMUSCULAR | Status: DC | PRN
  Administered 2023-02-27: 4 mL via SUBCUTANEOUS

## 2023-02-27 MED ORDER — SODIUM CHLORIDE 0.9 % IV SOLN
INTRAVENOUS | Status: DC | PRN
  Administered 2023-02-27 (×2): 5 mL via EPIDURAL

## 2023-02-27 MED ORDER — OXYTOCIN-SODIUM CHLORIDE 30-0.9 UT/500ML-% IV SOLN
1.0000 m[IU]/min | INTRAVENOUS | Status: DC
  Administered 2023-02-27: 4 m[IU]/min via INTRAVENOUS

## 2023-02-27 NOTE — Progress Notes (Signed)
L&D Note    Subjective:  Feeling some cramping, able to get rest last night   Objective:   Vitals:   02/26/23 1618 02/26/23 1921 02/27/23 0041 02/27/23 0642  BP: 128/78 (!) 123/91 (!) 126/54   Pulse: 81 84 75   Resp:      Temp: 97.9 F (36.6 C) 99.1 F (37.3 C)  98.1 F (36.7 C)  TempSrc: Oral Oral  Oral  Weight:      Height:        Current Vital Signs 24h Vital Sign Ranges  T 98.1 F (36.7 C) Temp  Avg: 98.4 F (36.9 C)  Min: 97.9 F (36.6 C)  Max: 99.1 F (37.3 C)  BP (!) 126/54 BP  Min: 117/64  Max: 139/89  HR 75 Pulse  Avg: 80.9  Min: 74  Max: 88  RR 18 Resp  Avg: 17  Min: 16  Max: 18  SaO2     No data recorded       Vitals:   02/26/23 0600 02/26/23 0844 02/26/23 0956 02/26/23 1133  BP: 137/67 139/89 127/80 137/74   02/26/23 1340 02/26/23 1618 02/26/23 1921 02/27/23 0041  BP: 117/64 128/78 (!) 123/91 (!) 126/54     Gen: alert, cooperative, no distress FHR: Baseline: 125 bpm, Variability: moderate, Accels: Present, Decels: none Toco: irregular, mild contractions, difficult to trace with external toco  SVE: Dilation: 1 Effacement (%): 60 Cervical Position: Anterior Station: -3 Presentation: Vertex Exam by:: Margaretmary Eddy, CNM  Medications SCHEDULED MEDICATIONS   misoprostol  25 mcg Oral Q4H   And   misoprostol  25 mcg Vaginal Q4H   oxytocin 40 units in LR 1000 mL  333 mL Intravenous Once    MEDICATION INFUSIONS   lactated ringers     lactated ringers 125 mL/hr at 02/26/23 2226   oxytocin     oxytocin Stopped (02/27/23 0712)    PRN MEDICATIONS  acetaminophen, fentaNYL (SUBLIMAZE) injection, lactated ringers, lidocaine (PF), ondansetron, sodium citrate-citric acid, terbutaline, terbutaline   Assessment & Plan:  27 y.o. G1P0000 at [redacted]w[redacted]d admitted for gestational hypertension  -Labor: cervical ripening with 3 doses of vaginal and oral misoprostol, followed by low dose oxytocin.  Attempted to place cook cath but could not d/t position of cervix which was  directly under the pubic bone.  Will stop oxytocin and restart vaginal/oral misoprostol.   -Fetal Well-being: Category I -GBS: negative -Membranes intact -Intervention: up to shower, will plan to eat breakfast, place vaginal and oral misoprostol in 1 hour -Analgesia:  planning epidural when in labor    Gustavo Lah, CNM  02/27/2023 7:50 AM  [facility]

## 2023-02-27 NOTE — Anesthesia Procedure Notes (Signed)
Epidural Patient location during procedure: OB Start time: 02/27/2023 8:44 PM End time: 02/27/2023 9:59 PM  Staffing Anesthesiologist: Lenard Simmer, MD Performed: anesthesiologist   Preanesthetic Checklist Completed: patient identified, IV checked, site marked, risks and benefits discussed, surgical consent, monitors and equipment checked, pre-op evaluation and timeout performed  Epidural Patient position: sitting Prep: ChloraPrep Patient monitoring: heart rate, continuous pulse ox and blood pressure Approach: midline Location: L3-L4 Injection technique: LOR saline  Needle:  Needle type: Tuohy  Needle gauge: 17 G Needle length: 9 cm Needle insertion depth: 9 cm Catheter type: closed end flexible Catheter size: 19 Gauge Catheter at skin depth: 15 cm Test dose: negative and 1.5% lidocaine with Epi 1:200 K  Assessment Sensory level: T10 Events: blood not aspirated, no cerebrospinal fluid, injection not painful, no injection resistance, no paresthesia and negative IV test  Additional Notes 1st attempt Pt. Evaluated and documentation done after procedure finished. Patient identified. Risks/Benefits/Options discussed with patient including but not limited to bleeding, infection, nerve damage, paralysis, failed block, incomplete pain control, headache, blood pressure changes, nausea, vomiting, reactions to medication both or allergic, itching and postpartum back pain. Confirmed with bedside nurse the patient's most recent platelet count. Confirmed with patient that they are not currently taking any anticoagulation, have any bleeding history or any family history of bleeding disorders. Patient expressed understanding and wished to proceed. All questions were answered. Sterile technique was used throughout the entire procedure. Please see nursing notes for vital signs. Test dose was given through epidural catheter and negative prior to continuing to dose epidural or start infusion.  Warning signs of high block given to the patient including shortness of breath, tingling/numbness in hands, complete motor block, or any concerning symptoms with instructions to call for help. Patient was given instructions on fall risk and not to get out of bed. All questions and concerns addressed with instructions to call with any issues or inadequate analgesia.    Patient tolerated the insertion well without immediate complications.Reason for block:procedure for pain

## 2023-02-27 NOTE — Anesthesia Preprocedure Evaluation (Addendum)
Anesthesia Evaluation  Patient identified by MRN, date of birth, ID band Patient awake    Reviewed: Allergy & Precautions, H&P , NPO status , Patient's Chart, lab work & pertinent test results, reviewed documented beta blocker date and time   History of Anesthesia Complications Negative for: history of anesthetic complications  Airway Mallampati: II  TM Distance: >3 FB Neck ROM: full    Dental no notable dental hx.    Pulmonary neg pulmonary ROS   Pulmonary exam normal breath sounds clear to auscultation       Cardiovascular Exercise Tolerance: Good (-) hypertension(-) angina (-) CAD, (-) Past MI and (-) Cardiac Stents Normal cardiovascular exam(-) dysrhythmias + Valvular Problems/Murmurs  Rhythm:regular Rate:Normal     Neuro/Psych negative neurological ROS  negative psych ROS   GI/Hepatic Neg liver ROS,GERD  ,,  Endo/Other  neg diabetes  Morbid obesity  Renal/GU negative Renal ROS  negative genitourinary   Musculoskeletal   Abdominal   Peds  Hematology negative hematology ROS (+)   Anesthesia Other Findings Past Medical History: No date: Murmur   Reproductive/Obstetrics (+) Pregnancy                             Anesthesia Physical Anesthesia Plan  ASA: 3  Anesthesia Plan: Epidural   Post-op Pain Management:    Induction:   PONV Risk Score and Plan:   Airway Management Planned: Natural Airway and Simple Face Mask  Additional Equipment:   Intra-op Plan:   Post-operative Plan:   Informed Consent: I have reviewed the patients History and Physical, chart, labs and discussed the procedure including the risks, benefits and alternatives for the proposed anesthesia with the patient or authorized representative who has indicated his/her understanding and acceptance.     Dental Advisory Given  Plan Discussed with: Anesthesiologist, CRNA and Surgeon  Anesthesia Plan Comments:         Anesthesia Quick Evaluation

## 2023-02-27 NOTE — Progress Notes (Signed)
RN having difficulty tracing patient's baby and ctx. Intermittently adjusting, have changed from external [place] monitor to external wireless monitor to reapplying new [place]. [place], CNM notified of difficulty tracing patient.

## 2023-02-27 NOTE — Progress Notes (Signed)
Labor Progress Note  Rose Bradley is a 27 y.o. G1P0000 at [redacted]w[redacted]d by LMP admitted for induction of labor due to gestational hypertension.  Subjective: Updated by RN, pt is still not feeling the contractions intensely and managing very well.  Objective: BP (!) 123/91 (BP Location: Left Arm)   Pulse 84   Temp 99.1 F (37.3 C) (Oral)   Resp 18   Ht 5\' 7"  (1.702 m)   Wt (!) 142.9 kg   LMP 05/29/2022   BMI 49.34 kg/m   Fetal Assessment: FHT:  FHR: 130 bpm, variability: moderate,  accelerations:  Present,  decelerations:  Absent Category/reactivity:  Category I UC:   q 1-6 mins  SVE:    Dilation: 1cm  Effacement: 60%  Station:  -3  Consistency: soft  Position: anterior  Membrane status: Intact Amniotic color: n/a  Labs: Lab Results  Component Value Date   WBC 12.6 (H) 02/26/2023   HGB 13.7 02/26/2023   HCT 40.6 02/26/2023   MCV 83.2 02/26/2023   PLT 359 02/26/2023    Assessment / Plan: Induction of labor due to gestational hypertension 0639 Cytotec PO and PV 1039 Cytotec PO and PV 1440 Cytotec PO and PV 1900 Pitocin started Currently at 14 mU - plan to AROM or IP foley when possible. Will do a pit break after 20mU and a dose of Tums.   Labor: Progressing normally Preeclampsia:   123/91, labs stable Fetal Wellbeing:  Category I Pain Control:  Labor support without medications I/D:   Afebrile, GBS neg, Intact Anticipated MOD:  NSVD  Cyril Mourning, CNM 02/27/2023, 12:17 AM

## 2023-02-27 NOTE — Progress Notes (Signed)
Patient ID: Rose Bradley, female   DOB: 1996-06-20, 27 y.o.   MRN: [id number] Day 2 induction  Cx AROM :  clear IUPC placed .  Cx 1-2 / 90 /-2  Start Pitocin

## 2023-02-27 NOTE — Progress Notes (Signed)
Reapplied external monitors to patient's non tender abdomen after Pitocin break and patient's nap. Tums administered per MAR. To restart Pitocin once RN to trace 20 minute reactive strip.

## 2023-02-27 NOTE — Progress Notes (Signed)
L&D Note    Subjective:  Starting to feel more cramping   Objective:   Vitals:   02/27/23 0041 02/27/23 0642 02/27/23 0712 02/27/23 1048  BP: (!) 126/54  125/77 (!) 105/49  Pulse: 75  72 60  Resp:   18 16  Temp:  98.1 F (36.7 C) 98.4 F (36.9 C) 98.2 F (36.8 C)  TempSrc:  Oral Oral Oral  Weight:      Height:        Current Vital Signs 24h Vital Sign Ranges  T 98.2 F (36.8 C) Temp  Avg: 98.3 F (36.8 C)  Min: 97.9 F (36.6 C)  Max: 99.1 F (37.3 C)  BP (!) 105/49 BP  Min: 105/49  Max: 128/78  HR 60 Pulse  Avg: 75.3  Min: 60  Max: 84  RR 16 Resp  Avg: 17.3  Min: 16  Max: 18  SaO2     No data recorded      Gen: alert, cooperative, no distress FHR: Baseline: 135 bpm, Variability: moderate, Accels: Present, Decels: none Toco: irregular, mild contractions  SVE: Dilation: 1 Effacement (%): 70 Cervical Position: Anterior Station: -3 Presentation: Vertex Exam by:: Margaretmary Eddy, CNM  Medications SCHEDULED MEDICATIONS   misoprostol  25 mcg Oral Q4H   And   misoprostol  25 mcg Vaginal Q4H   oxytocin 40 units in LR 1000 mL  333 mL Intravenous Once    MEDICATION INFUSIONS   lactated ringers     lactated ringers 125 mL/hr at 02/27/23 0855   oxytocin     oxytocin Stopped (02/27/23 0712)    PRN MEDICATIONS  acetaminophen, fentaNYL (SUBLIMAZE) injection, lactated ringers, lidocaine (PF), ondansetron, sodium citrate-citric acid, terbutaline, terbutaline   Assessment & Plan:  27 y.o. G1P0000 at [redacted]w[redacted]d admitted for gestational hypertension -Labor: cervical ripening with misoprostol  --02/26/23 - 3 doses of vaginal and oral misoprostol  --started oxytocin for 12 hours  --0730 oxytocin turned and vaginal/oral misoprostol given at 0830, 2nd dose given now -Fetal Well-being: Category I -GBS: negative -Membranes intact -Continue present management. -Analgesia:  Planning epidural when in labor  -Dr. Feliberto Gottron updated on progress  Gustavo Lah, CNM  02/27/2023 12:59 PM   [facility]

## 2023-02-27 NOTE — Progress Notes (Signed)
L&D Note    Subjective:  Feeling more comfortable with epidural  Objective:   Vitals:   02/27/23 2209 02/27/23 2210 02/27/23 2215 02/27/23 2220  BP: (!) 122/50     Pulse: (!) 115     Resp: 17     Temp: 98 F (36.7 C)     TempSrc: Oral     SpO2:  100% 100% 100%  Weight:      Height:        Current Vital Signs 24h Vital Sign Ranges  T 98 F (36.7 C) Temp  Avg: 98.1 F (36.7 C)  Min: 98 F (36.7 C)  Max: 98.4 F (36.9 C)  BP (!) 122/50 BP  Min: 89/40  Max: 189/174  HR (!) 115 Pulse  Avg: 87.7  Min: 58  Max: 122  RR 17 Resp  Avg: 17  Min: 16  Max: 18  SaO2 100 %   SpO2  Avg: 99.8 %  Min: 98 %  Max: 100 %      Gen: alert, cooperative, no distress FHR: Baseline: 135 bpm, Variability: moderate, Accels: Present, Decels: none Toco: regular, every 2-4 minutes, IUPC replaced  SVE: Dilation: 3 Effacement (%): 90 Cervical Position: Anterior Station: -2 Presentation: Vertex Exam by:: Margaretmary Eddy CNM  Medications SCHEDULED MEDICATIONS   misoprostol  25 mcg Oral Q4H   And   misoprostol  25 mcg Vaginal Q4H   oxytocin 40 units in LR 1000 mL  333 mL Intravenous Once    MEDICATION INFUSIONS   fentaNYL 2 mcg/mL w/bupivacaine 0.125% in NS 250 mL 12 mL/hr (02/27/23 2110)   lactated ringers     lactated ringers     lactated ringers 125 mL/hr at 02/27/23 1501   oxytocin     oxytocin 20 milli-units/min (02/27/23 2241)    PRN MEDICATIONS  acetaminophen, diphenhydrAMINE, ePHEDrine, ePHEDrine, fentaNYL (SUBLIMAZE) injection, fentaNYL 2 mcg/mL w/bupivacaine 0.125% in NS 250 mL, lactated ringers, lidocaine (PF), ondansetron, phenylephrine, phenylephrine, sodium citrate-citric acid, terbutaline, terbutaline   Assessment & Plan:  27 y.o. G1P0000 at [redacted]w[redacted]d admitted for gestational hypertension  -Labor: cervical ripening with misoprostol -> oxytocin -> misoprostol. Now on high dose oxytocin protocol, IUPC in place -Fetal Well-being: Category I -GBS: negative -Membranes ruptured for clear  fluid at 1703.  -Continue present management.  IUPC replaced, initial IUPC dislodged during patient repositioning after epidural  -Analgesia: regional anesthesia   Gustavo Lah, CNM  02/27/2023 10:59 PM  [facility]

## 2023-02-28 ENCOUNTER — Encounter: Payer: Self-pay | Admitting: Obstetrics and Gynecology

## 2023-02-28 MED ORDER — SIMETHICONE 80 MG PO CHEW: 80.0000 mg | CHEWABLE_TABLET | ORAL | Status: DC | PRN

## 2023-02-28 MED ORDER — DIBUCAINE (PERIANAL) 1 % EX OINT: 1.0000 | TOPICAL_OINTMENT | CUTANEOUS | Status: DC | PRN

## 2023-02-28 MED ORDER — TETANUS-DIPHTH-ACELL PERTUSSIS 5-2.5-18.5 LF-MCG/0.5 IM SUSY
0.5000 mL | PREFILLED_SYRINGE | Freq: Once | INTRAMUSCULAR | Status: AC
  Administered 2023-03-01: 0.5 mL via INTRAMUSCULAR
  Filled 2023-02-28 (×2): qty 0.5

## 2023-02-28 MED ORDER — MISOPROSTOL 200 MCG PO TABS
800.0000 ug | ORAL_TABLET | Freq: Once | ORAL | Status: AC
  Administered 2023-02-28: 800 ug via RECTAL

## 2023-02-28 MED ORDER — OXYTOCIN 10 UNIT/ML IJ SOLN
10.0000 [IU] | Freq: Once | INTRAMUSCULAR | Status: AC
  Administered 2023-02-28: 10 [IU] via INTRAMUSCULAR

## 2023-02-28 MED ORDER — PRENATAL MULTIVITAMIN CH
1.0000 | ORAL_TABLET | Freq: Every day | ORAL | Status: DC
  Administered 2023-03-01: 1 via ORAL
  Filled 2023-02-28: qty 1

## 2023-02-28 MED ORDER — BENZOCAINE-MENTHOL 20-0.5 % EX AERO
1.0000 | INHALATION_SPRAY | CUTANEOUS | Status: DC | PRN
  Administered 2023-02-28: 1 via TOPICAL
  Filled 2023-02-28: qty 56

## 2023-02-28 MED ORDER — CEFAZOLIN SODIUM-DEXTROSE 1-4 GM/50ML-% IV SOLN
1.0000 g | Freq: Once | INTRAVENOUS | Status: AC
  Administered 2023-02-28: 1 g via INTRAVENOUS

## 2023-02-28 MED ORDER — ONDANSETRON HCL 4 MG PO TABS: 4.0000 mg | ORAL_TABLET | ORAL | Status: DC | PRN

## 2023-02-28 MED ORDER — ONDANSETRON HCL 4 MG/2ML IJ SOLN: 4.0000 mg | INTRAMUSCULAR | Status: DC | PRN

## 2023-02-28 MED ORDER — SENNOSIDES-DOCUSATE SODIUM 8.6-50 MG PO TABS
2.0000 | ORAL_TABLET | Freq: Every day | ORAL | Status: DC
  Filled 2023-02-28: qty 2

## 2023-02-28 MED ORDER — ACETAMINOPHEN 325 MG PO TABS: 650.0000 mg | ORAL_TABLET | ORAL | Status: DC | PRN

## 2023-02-28 MED ORDER — ZOLPIDEM TARTRATE 5 MG PO TABS: 5.0000 mg | ORAL_TABLET | Freq: Every evening | ORAL | Status: DC | PRN

## 2023-02-28 MED ORDER — WITCH HAZEL-GLYCERIN EX PADS
1.0000 | MEDICATED_PAD | CUTANEOUS | Status: DC | PRN
  Administered 2023-02-28: 1 via TOPICAL
  Filled 2023-02-28: qty 100

## 2023-02-28 MED ORDER — IBUPROFEN 600 MG PO TABS
600.0000 mg | ORAL_TABLET | Freq: Four times a day (QID) | ORAL | Status: DC
  Administered 2023-02-28 – 2023-03-01 (×5): 600 mg via ORAL
  Filled 2023-02-28 (×5): qty 1

## 2023-02-28 MED ORDER — COCONUT OIL OIL: 1.0000 | TOPICAL_OIL | Status: DC | PRN

## 2023-02-28 MED ORDER — CEFAZOLIN SODIUM-DEXTROSE 1-4 GM/50ML-% IV SOLN
INTRAVENOUS | Status: AC
  Filled 2023-02-28: qty 50

## 2023-02-28 MED ORDER — DIPHENHYDRAMINE HCL 25 MG PO CAPS: 25.0000 mg | ORAL_CAPSULE | Freq: Four times a day (QID) | ORAL | Status: DC | PRN

## 2023-02-28 NOTE — Lactation Note (Addendum)
This note was copied from a baby's chart. Lactation Consultation Note  Patient Name: Rose Bradley [id number] Date: 02/28/2023 Age:26 hours Reason for consult: L&D Initial assessment   Maternal Data Has patient been taught Hand Expression?: Yes Does the patient have breastfeeding experience prior to this delivery?: No  Feeding Mother's Current Feeding Choice: Breast Milk Assisted mom with first feed after delivery, baby sl interested but not rooting at present, apgars 6 and 9, mom has Jada in place so cannot position herself well, also has large breast and sl flat nipples, hand expression yields several drops of colostrum, baby able to latch to left breast in football hold with compression of breast and support with pillows, would suck a few times and just hold breast in mouth, nursed x approx 10 min, family in to visit, will assist again after visits   LATCH Score Latch: Repeated attempts needed to sustain latch, nipple held in mouth throughout feeding, stimulation needed to elicit sucking reflex.  Audible Swallowing: A few with stimulation  Type of Nipple: Everted at rest and after stimulation (large breasts, right nipple sl flat, tissue compressible)  Comfort (Breast/Nipple): Soft / non-tender  Hold (Positioning): Assistance needed to correctly position infant at breast and maintain latch.  LATCH Score: 7   Lactation Tools Discussed/Used    Interventions Interventions: Breast feeding basics reviewed;Assisted with latch;Hand express;Breast compression;Adjust position;Support pillows;Education  Discharge Pump: Personal (handsfree pump) WIC Program: No  Consult Status Consult Status: Follow-up from L&D    Dyann Kief 02/28/2023, 3:41 PM

## 2023-02-28 NOTE — Progress Notes (Signed)
Labor Progress Note  Rose Bradley is a 27 y.o. G1P0000 at [redacted]w[redacted]d by LMP admitted for induction of labor due to gestational HTN.  Subjective: she feels constant rectal pressure  Objective: BP 133/87 (BP Location: Left Arm)   Pulse 97   Temp 98.7 F (37.1 C) (Oral)   Resp 18   Ht 5\' 7"  (1.702 m)   Wt (!) 142.9 kg   LMP 05/29/2022   SpO2 94%   BMI 49.34 kg/m  Notable VS details: reviewed  Fetal Assessment: FHT:  FHR: 120 bpm, variability: moderate,  accelerations:  Abscent,  decelerations:  Absent Category/reactivity:  Category I UC:   regular, every 2-5 minutes SVE:    Dilation: 10cm  Effacement: 100%  Station:  +2  Consistency: soft  Position: anterior  Membrane status:AROM 02/27/23 @ 1703 Amniotic color: clear  Labs: Lab Results  Component Value Date   WBC 12.6 (H) 02/26/2023   HGB 13.7 02/26/2023   HCT 40.6 02/26/2023   MCV 83.2 02/26/2023   PLT 359 02/26/2023    Assessment / Plan: 27 year old G1P0 at [redacted]w[redacted]d here for IOL for gHTN  Labor:  Good labor progress since AROM, now second stage, begin pushing , Dr. Feliberto Gottron notified of second stage. Preeclampsia:  labs stable Fetal Wellbeing:  Category I Pain Control:  Epidural I/D:   GBS negative Anticipated MOD:  NSVD  Janyce Llanos, CNM 02/28/2023, 11:20 AM

## 2023-02-28 NOTE — Progress Notes (Signed)
L&D Note    Subjective:  Comfortable with epidural. Called to room for prolong decel.   Objective:   Vitals:   02/28/23 0439 02/28/23 0440 02/28/23 0445 02/28/23 0450  BP: (!) 93/42     Pulse: 66     Resp:      Temp:      TempSrc:      SpO2:  100% 100% 100%  Weight:      Height:        Current Vital Signs 24h Vital Sign Ranges  T 98 F (36.7 C) Temp  Avg: 98.2 F (36.8 C)  Min: 98 F (36.7 C)  Max: 98.6 F (37 C)  BP (!) 93/42 BP  Min: 89/40  Max: 189/174  HR 66 Pulse  Avg: 83.5  Min: 58  Max: 122  RR 16 Resp  Avg: 16.8  Min: 16  Max: 18  SaO2 100 %   SpO2  Avg: 99.8 %  Min: 98 %  Max: 100 %      Gen: alert, cooperative, no distress FHR: Baseline: 120 bpm, Variability: moderate, Accels: Present, Decels: variable and prolong decel  Toco: regular, every 2-4 minutes SVE: Dilation: 9 Effacement (%): 100 Cervical Position: Anterior Station: -2 Presentation: Vertex Exam by:: Exie Parody RN  Medications SCHEDULED MEDICATIONS   misoprostol  25 mcg Oral Q4H   And   misoprostol  25 mcg Vaginal Q4H   oxytocin 40 units in LR 1000 mL  333 mL Intravenous Once    MEDICATION INFUSIONS   fentaNYL 2 mcg/mL w/bupivacaine 0.125% in NS 250 mL 12 mL/hr (02/27/23 2110)   lactated ringers     lactated ringers     lactated ringers 125 mL/hr at 02/27/23 1501   oxytocin     oxytocin      PRN MEDICATIONS  acetaminophen, diphenhydrAMINE, ePHEDrine, ePHEDrine, fentaNYL (SUBLIMAZE) injection, fentaNYL 2 mcg/mL w/bupivacaine 0.125% in NS 250 mL, lactated ringers, lidocaine (PF), ondansetron, phenylephrine, phenylephrine, sodium citrate-citric acid, terbutaline, terbutaline   Assessment & Plan:  27 y.o. G1P0000 at [redacted]w[redacted]d admitted for gestational hypertension -Labor: Active phase labor. -Fetal Well-being: Category II - overall reassuring with return to baseline and moderate variability  -GBS: negative -Membranes ruptured for clear fluid at 1703 -Intervention: IV fluid bolus, reduce  stimulation (IV Pitocin), and change maternal position.  Will restart oxytocin after recovery.   -Analgesia: regional anesthesia   Gustavo Lah, CNM  02/28/2023 5:28 AM  [facility]

## 2023-02-28 NOTE — Lactation Note (Addendum)
This note was copied from a baby's chart. Lactation Consultation Note  Patient Name: Boy Alexcis Posadas [id number] Date: 02/28/2023 Age:26 hours Reason for consult: Follow-up assessment   Maternal Data Has patient been taught Hand Expression?: Yes Does the patient have breastfeeding experience prior to this delivery?: No  Feeding Mother's Current Feeding Choice: Breast Milk Mom called for assistance with attempting breast feeding, had mom turn to left side and baby positioned beside her, baby more alert and was able to latch with breast compression after hand expression,  but unable to maintain latch, sucks off and on x 5 min, mom assisted with sitting up and baby then placed in football hold on right breast , baby able to latch after hand expression but mom needs much assist with sandwiching - supporting breast to assist baby with maintaining latch, baby more alert and at end of feeding had wide open mouth at breast, nursed 10 min, when mom moved to mother baby unit mom should be able to position herself better to be able to see baby latch and will be more able to support breast for baby.     LATCH Score Latch: Repeated attempts needed to sustain latch, nipple held in mouth throughout feeding, stimulation needed to elicit sucking reflex.  Audible Swallowing: A few with stimulation  Type of Nipple: Flat (right nipple flat but will evert with stimulation)  Comfort (Breast/Nipple): Soft / non-tender  Hold (Positioning): Assistance needed to correctly position infant at breast and maintain latch.  LATCH Score: 6   Lactation Tools Discussed/Used    Interventions Interventions: Assisted with latch;Skin to skin;Hand express;Breast compression;Adjust position;Support pillows  Discharge Pump: Personal (handsfree pump) WIC Program: No  Consult Status Consult Status: Follow-up from L&D    Dyann Kief 02/28/2023, 5:24 PM

## 2023-02-28 NOTE — Discharge Summary (Shared)
Obstetrical Discharge Summary  Patient Name: Rose Bradley DOB: [birth year] MRN: [id number]  Date of Admission: 02/26/2023 Date of Delivery: 02/28/23    Delivered by: Donato Schultz, CNM Date of Discharge: 03/01/2023  Primary OB: [facility] [id number] last menstrual period was 05/29/2022. EDC Estimated Date of Delivery: 03/05/23 Gestational Age at Delivery: [redacted]w[redacted]d   Antepartum complications:  GHTN  Scoliosis BMI 40 or more - BMI at NOB 44 History of sacrum and coccyx fracture  Admitting Diagnosis: IOL for gHTN Secondary Diagnosis: NSVD, postpartum hemorrhage Patient Active Problem List   Diagnosis Date Noted   Encounter for planned induction of labor 02/26/2023   Elevated blood pressure affecting pregnancy in third trimester, antepartum 02/19/2023    Augmentation: AROM, Pitocin, and Cytotec Complications: None Intrapartum complications/course: She arrived for IOL and received cytotec then pitocin and AROM. She progressed to 10/100/+2 and pushed about 2.5hrs, delivering viable female infant over 1st degree laceration. Apgars 6, 9. Postpartum hemorrhage and Jada device placement.  Date of Delivery: 02/28/23 Delivered By: Donato Schultz, CNM Delivery Type: spontaneous vaginal delivery Anesthesia: epidural Placenta: spontaneous Laceration: 1st degree, repaired Episiotomy: none Newborn Data: Live born female  Birth Weight: 7 lb 14.3 oz (3580 g) APGAR: 6, 9  Newborn Delivery   Birth date/time: 02/28/2023 13:29:00 Delivery type: Vaginal, Spontaneous      Postpartum Procedures: none Edinburgh:     02/28/2023    8:05 PM  Edinburgh Postnatal Depression Scale Screening Tool  I have been able to laugh and see the funny side of things. 0  I have looked forward with enjoyment to things. 0  I have blamed myself unnecessarily when things went wrong. 0  I have been anxious or worried for no good reason. 1  I have felt scared or panicky for no good reason. 0  Things have  been getting on top of me. 1  I have been so unhappy that I have had difficulty sleeping. 0  I have felt sad or miserable. 0  I have been so unhappy that I have been crying. 0  The thought of harming myself has occurred to me. 0  Edinburgh Postnatal Depression Scale Total 2      Post partum course:   Patient had an uncomplicated postpartum course.  By time of discharge on PPD#1, her pain was controlled on oral pain medications; she had appropriate lochia and was ambulating, voiding without difficulty and tolerating regular diet.  She was deemed stable for discharge to home.    Discharge Physical Exam:   BP 128/85 (BP Location: Left Arm)   Pulse 77   Temp 98 F (36.7 C) (Oral)   Resp 18   Ht 5\' 7"  (1.702 m)   Wt (!) 142.9 kg   LMP 05/29/2022   SpO2 100%   Breastfeeding Unknown   BMI 49.34 kg/m   General: NAD CV: RRR Pulm: CTABL, nl effort ABD: s/nd/nt, fundus firm and below the umbilicus Lochia: moderate Perineum: well approximated DVT Evaluation: LE non-ttp, no evidence of DVT on exam.  Hemoglobin  Date Value Ref Range Status  03/01/2023 10.4 (L) 12.0 - 15.0 g/dL Final   HCT  Date Value Ref Range Status  03/01/2023 31.0 (L) 36.0 - 46.0 % Final    Disposition: stable, discharge to home. Baby Feeding: formula Baby Disposition: home with mom  Rh Immune globulin given: n/a, O pos Rubella vaccine given: immune Varicella vaccine given: non-immune,   Tdap vaccine given in AP or PP setting: received Flu vaccine given in  AP or PP setting: n/a  Contraception: TBD  Prenatal Labs:  Blood type/Rh O pos  Antibody screen neg  Rubella Immune  Varicella Non-Immune  RPR NR  HBsAg Neg  HIV NR  GC neg  Chlamydia neg  Genetic screening negative  1 hour GTT 77  3 hour GTT    GBS neg     Plan:  Rose Bradley was discharged to home in good condition. Follow-up appointment with delivering provider in 6 weeks.  Discharge Medications: Allergies as of 03/01/2023        Reactions   Prednisone Nausea And Vomiting   Amoxicillin Nausea And Vomiting   Duloxetine Hcl    Other reaction(s): felt bad   Penicillin G    Other reaction(s): Unknown   Penicillins Other (See Comments)   unknown        Medication List     STOP taking these medications    aspirin EC 81 MG tablet       TAKE these medications    acetaminophen 325 MG tablet Commonly known as: Tylenol Take 2 tablets (650 mg total) by mouth every 6 (six) hours as needed for fever or mild pain (for pain scale < 4).   benzocaine-Menthol 20-0.5 % Aero Commonly known as: DERMOPLAST Apply 1 Application topically as needed for irritation (perineal discomfort).   dibucaine 1 % Oint Commonly known as: NUPERCAINAL Place 1 Application rectally as needed for hemorrhoids.   ferrous sulfate 325 (65 FE) MG tablet Take 1 tablet (325 mg total) by mouth 2 (two) times daily with a meal. For anemia, take with Vitamin C   ibuprofen 600 MG tablet Commonly known as: ADVIL Take 1 tablet (600 mg total) by mouth every 6 (six) hours as needed for cramping or mild pain.   prenatal multivitamin Tabs tablet Take 1 tablet by mouth daily at 12 noon.   senna-docusate 8.6-50 MG tablet Commonly known as: Senokot-S Take 2 tablets by mouth at bedtime as needed for mild constipation.   witch hazel-glycerin pad Commonly known as: TUCKS Apply 1 Application topically as needed for hemorrhoids.         Follow-up Information     Janyce Llanos, CNM Follow up in 6 week(s).   Specialty: Certified Nurse Midwife Why: 6wk postpartum Contact information: [address] [place] [address] [phone]         [facility] Follow up in 1 week(s).   Why: BP check Contact information: [address] [place] [address] [phone]                Signed:  Blanchard Kelch 03/01/2023 5:02 PM

## 2023-03-01 LAB — CBC
HCT: 32.9 % — ABNORMAL LOW (ref 36.0–46.0)
HCT: 31 % — ABNORMAL LOW (ref 36.0–46.0)
Hemoglobin: 11 g/dL — ABNORMAL LOW (ref 12.0–15.0)
Hemoglobin: 10.4 g/dL — ABNORMAL LOW (ref 12.0–15.0)
MCH: 28.4 pg (ref 26.0–34.0)
MCH: 28.6 pg (ref 26.0–34.0)
MCHC: 33.4 g/dL (ref 30.0–36.0)
MCHC: 33.5 g/dL (ref 30.0–36.0)
MCV: 84.8 fL (ref 80.0–100.0)
MCV: 85.2 fL (ref 80.0–100.0)
Platelets: 281 10*3/uL (ref 150–400)
Platelets: 275 10*3/uL (ref 150–400)
RBC: 3.88 MIL/uL (ref 3.87–5.11)
RBC: 3.64 MIL/uL — ABNORMAL LOW (ref 3.87–5.11)
RDW: 13.4 % (ref 11.5–15.5)
RDW: 13.4 % (ref 11.5–15.5)
WBC: 25.1 10*3/uL — ABNORMAL HIGH (ref 4.0–10.5)
WBC: 21.6 10*3/uL — ABNORMAL HIGH (ref 4.0–10.5)
nRBC: 0 % (ref 0.0–0.2)
nRBC: 0 % (ref 0.0–0.2)

## 2023-03-01 MED ORDER — ACETAMINOPHEN 325 MG PO TABS: 650.0000 mg | ORAL_TABLET | Freq: Four times a day (QID) | ORAL | 0 refills | Status: AC | PRN

## 2023-03-01 MED ORDER — DIBUCAINE (PERIANAL) 1 % EX OINT: 1.0000 | TOPICAL_OINTMENT | CUTANEOUS | 0 refills | Status: AC | PRN

## 2023-03-01 MED ORDER — BENZOCAINE-MENTHOL 20-0.5 % EX AERO: 1.0000 | INHALATION_SPRAY | CUTANEOUS | 0 refills | Status: AC | PRN

## 2023-03-01 MED ORDER — SENNOSIDES-DOCUSATE SODIUM 8.6-50 MG PO TABS: 2.0000 | ORAL_TABLET | Freq: Every evening | ORAL | 0 refills | Status: AC | PRN

## 2023-03-01 MED ORDER — FERROUS SULFATE 325 (65 FE) MG PO TABS: 325.0000 mg | ORAL_TABLET | Freq: Two times a day (BID) | ORAL | 3 refills | Status: AC

## 2023-03-01 MED ORDER — IBUPROFEN 600 MG PO TABS: 600.0000 mg | ORAL_TABLET | Freq: Four times a day (QID) | ORAL | 0 refills | Status: AC | PRN

## 2023-03-01 MED ORDER — WITCH HAZEL-GLYCERIN EX PADS: 1.0000 | MEDICATED_PAD | CUTANEOUS | 1 refills | Status: AC | PRN

## 2023-03-01 NOTE — Anesthesia Postprocedure Evaluation (Addendum)
Anesthesia Post Note  Patient: Rose Bradley  Procedure(s) Performed: AN AD HOC LABOR EPIDURAL  Patient location during evaluation: Mother Baby Anesthesia Type: Epidural Level of consciousness: awake and alert Pain management: pain level controlled Vital Signs Assessment: post-procedure vital signs reviewed and stable Respiratory status: spontaneous breathing, nonlabored ventilation and respiratory function stable Cardiovascular status: blood pressure returned to baseline and stable Postop Assessment: able to ambulate, no backache, no headache, patient able to bend at knees and no apparent nausea or vomiting Anesthetic complications: no   No notable events documented.   Last Vitals:  Vitals:   02/28/23 2314 03/01/23 0326  BP: 134/70 115/76  Pulse: 89 65  Resp: 18 18  Temp: 36.6 C 36.4 C  SpO2: 98% 100%    Last Pain:  Vitals:   03/01/23 0330  TempSrc:   PainSc: 0-No pain                 Foye Deer

## 2023-03-01 NOTE — Progress Notes (Signed)
Discharge instructions reviewed with patient  and significant other.  Printed copies given to patient for reference after discharge home.  Patient transported to medical mall via wheelchair for discharge home.

## 2023-03-01 NOTE — Discharge Instructions (Signed)
Call office to schedule your 1 week and 6 week appointments.  If you have questions or concerns call your on call provider.  If you have urgent concerns please go to the nearest emergency department for assessment.

## 2023-03-01 NOTE — Lactation Note (Signed)
This note was copied from a baby's chart. Lactation Consultation Note  Patient Name: Rose Bradley Date: 03/01/2023 Age:26 hours   Care RN reports that mom has decided to only formula feed.    Rose Bradley 03/01/2023, 9:28 AM

## 2023-03-25 ENCOUNTER — Telehealth: Payer: Self-pay
